# Patient Record
Sex: Male | Born: 1975 | Race: Black or African American | Hispanic: No | Marital: Single
Health system: Midwestern US, Community
[De-identification: ages and names within clinical notes are randomized; demographics above are authoritative.]

## PROBLEM LIST (undated history)

## (undated) DIAGNOSIS — T7840XA Allergy, unspecified, initial encounter: Secondary | ICD-10-CM

## (undated) DIAGNOSIS — J4540 Moderate persistent asthma, uncomplicated: Secondary | ICD-10-CM

## (undated) DIAGNOSIS — F17200 Nicotine dependence, unspecified, uncomplicated: Secondary | ICD-10-CM

## (undated) DIAGNOSIS — D563 Thalassemia minor: Secondary | ICD-10-CM

## (undated) DIAGNOSIS — J302 Other seasonal allergic rhinitis: Secondary | ICD-10-CM

## (undated) DIAGNOSIS — Z1211 Encounter for screening for malignant neoplasm of colon: Secondary | ICD-10-CM

## (undated) HISTORY — DX: Thalassemia minor: D56.3

## (undated) HISTORY — DX: Nicotine dependence, unspecified, uncomplicated: F17.200

## (undated) HISTORY — DX: Moderate persistent asthma, uncomplicated: J45.40

## (undated) HISTORY — DX: Encounter for screening for malignant neoplasm of colon: Z12.11

## (undated) HISTORY — DX: Other seasonal allergic rhinitis: J30.2

## (undated) HISTORY — DX: Allergy, unspecified, initial encounter: T78.40XA

---

## 2000-10-26 ENCOUNTER — Encounter: Payer: Self-pay | Admitting: Emergency Medicine

## 2000-10-26 ENCOUNTER — Emergency Department (HOSPITAL_COMMUNITY): Admission: EM | Admit: 2000-10-26 | Discharge: 2000-10-26 | Payer: Self-pay | Admitting: Emergency Medicine

## 2002-06-15 ENCOUNTER — Encounter: Payer: Self-pay | Admitting: Emergency Medicine

## 2002-06-15 ENCOUNTER — Emergency Department (HOSPITAL_COMMUNITY): Admission: EM | Admit: 2002-06-15 | Discharge: 2002-06-15 | Payer: Self-pay | Admitting: Emergency Medicine

## 2002-09-15 ENCOUNTER — Encounter: Payer: Self-pay | Admitting: Emergency Medicine

## 2002-09-15 ENCOUNTER — Emergency Department (HOSPITAL_COMMUNITY): Admission: EM | Admit: 2002-09-15 | Discharge: 2002-09-15 | Payer: Self-pay | Admitting: Emergency Medicine

## 2003-03-22 ENCOUNTER — Emergency Department (HOSPITAL_COMMUNITY): Admission: EM | Admit: 2003-03-22 | Discharge: 2003-03-22 | Payer: Self-pay | Admitting: Emergency Medicine

## 2005-01-08 ENCOUNTER — Emergency Department (HOSPITAL_COMMUNITY): Admission: EM | Admit: 2005-01-08 | Discharge: 2005-01-08 | Payer: Self-pay | Admitting: Emergency Medicine

## 2005-12-01 ENCOUNTER — Emergency Department (HOSPITAL_COMMUNITY): Admission: EM | Admit: 2005-12-01 | Discharge: 2005-12-01 | Payer: Self-pay | Admitting: Emergency Medicine

## 2007-07-17 IMAGING — CR DG CHEST 2V
2 series · 2 of 2 positions shown · non-contrast
Comparison: none

HISTORY: Asthma, tachycardia, chest pain, headache

CHEST 2 VIEWS:
Prior exams not currently available for comparison.
Normal heart size, mediastinal contours, and vascularity.
Lungs clear.
No effusion or pneumothorax.
Bones unremarkable.

[w chest pa]
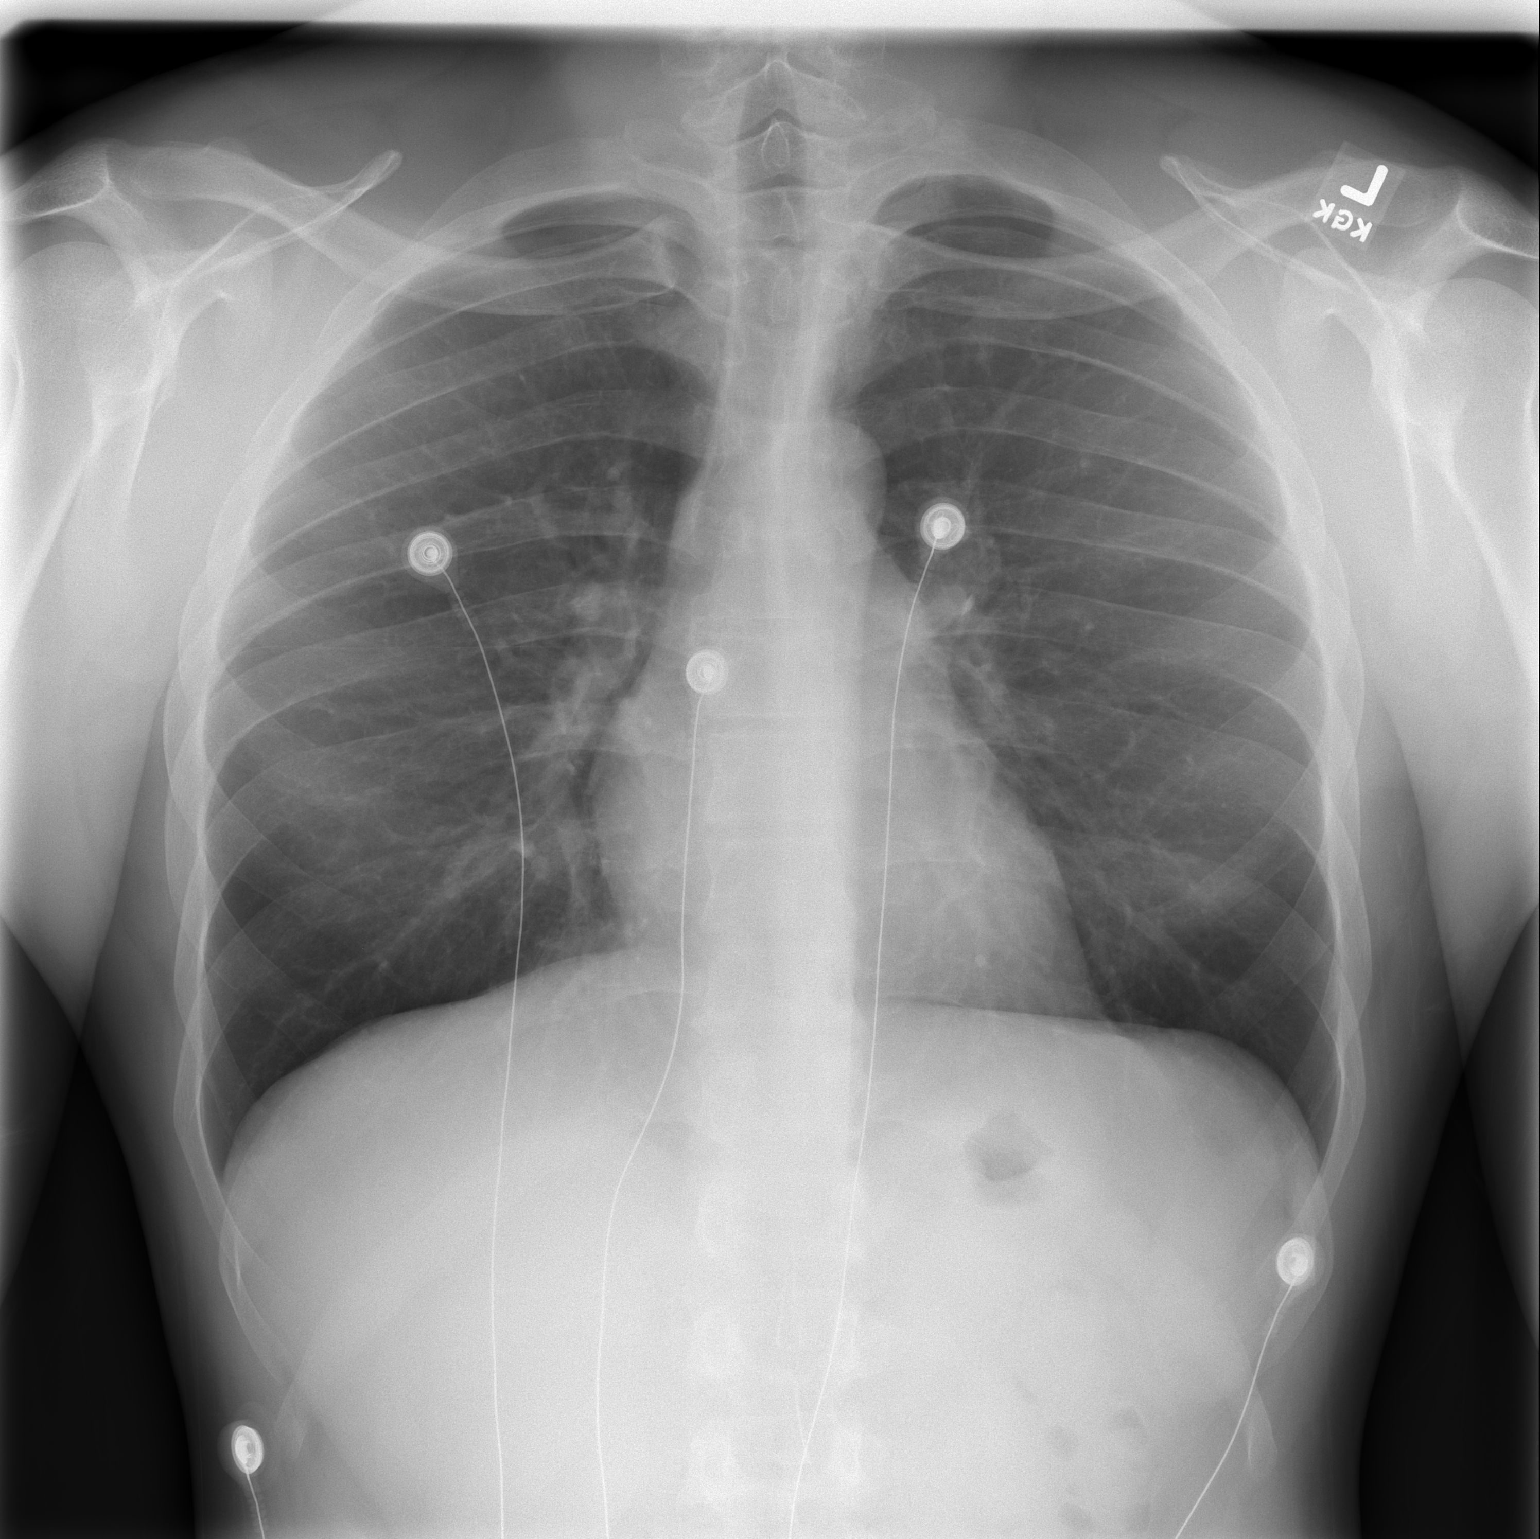

[w chest lat]
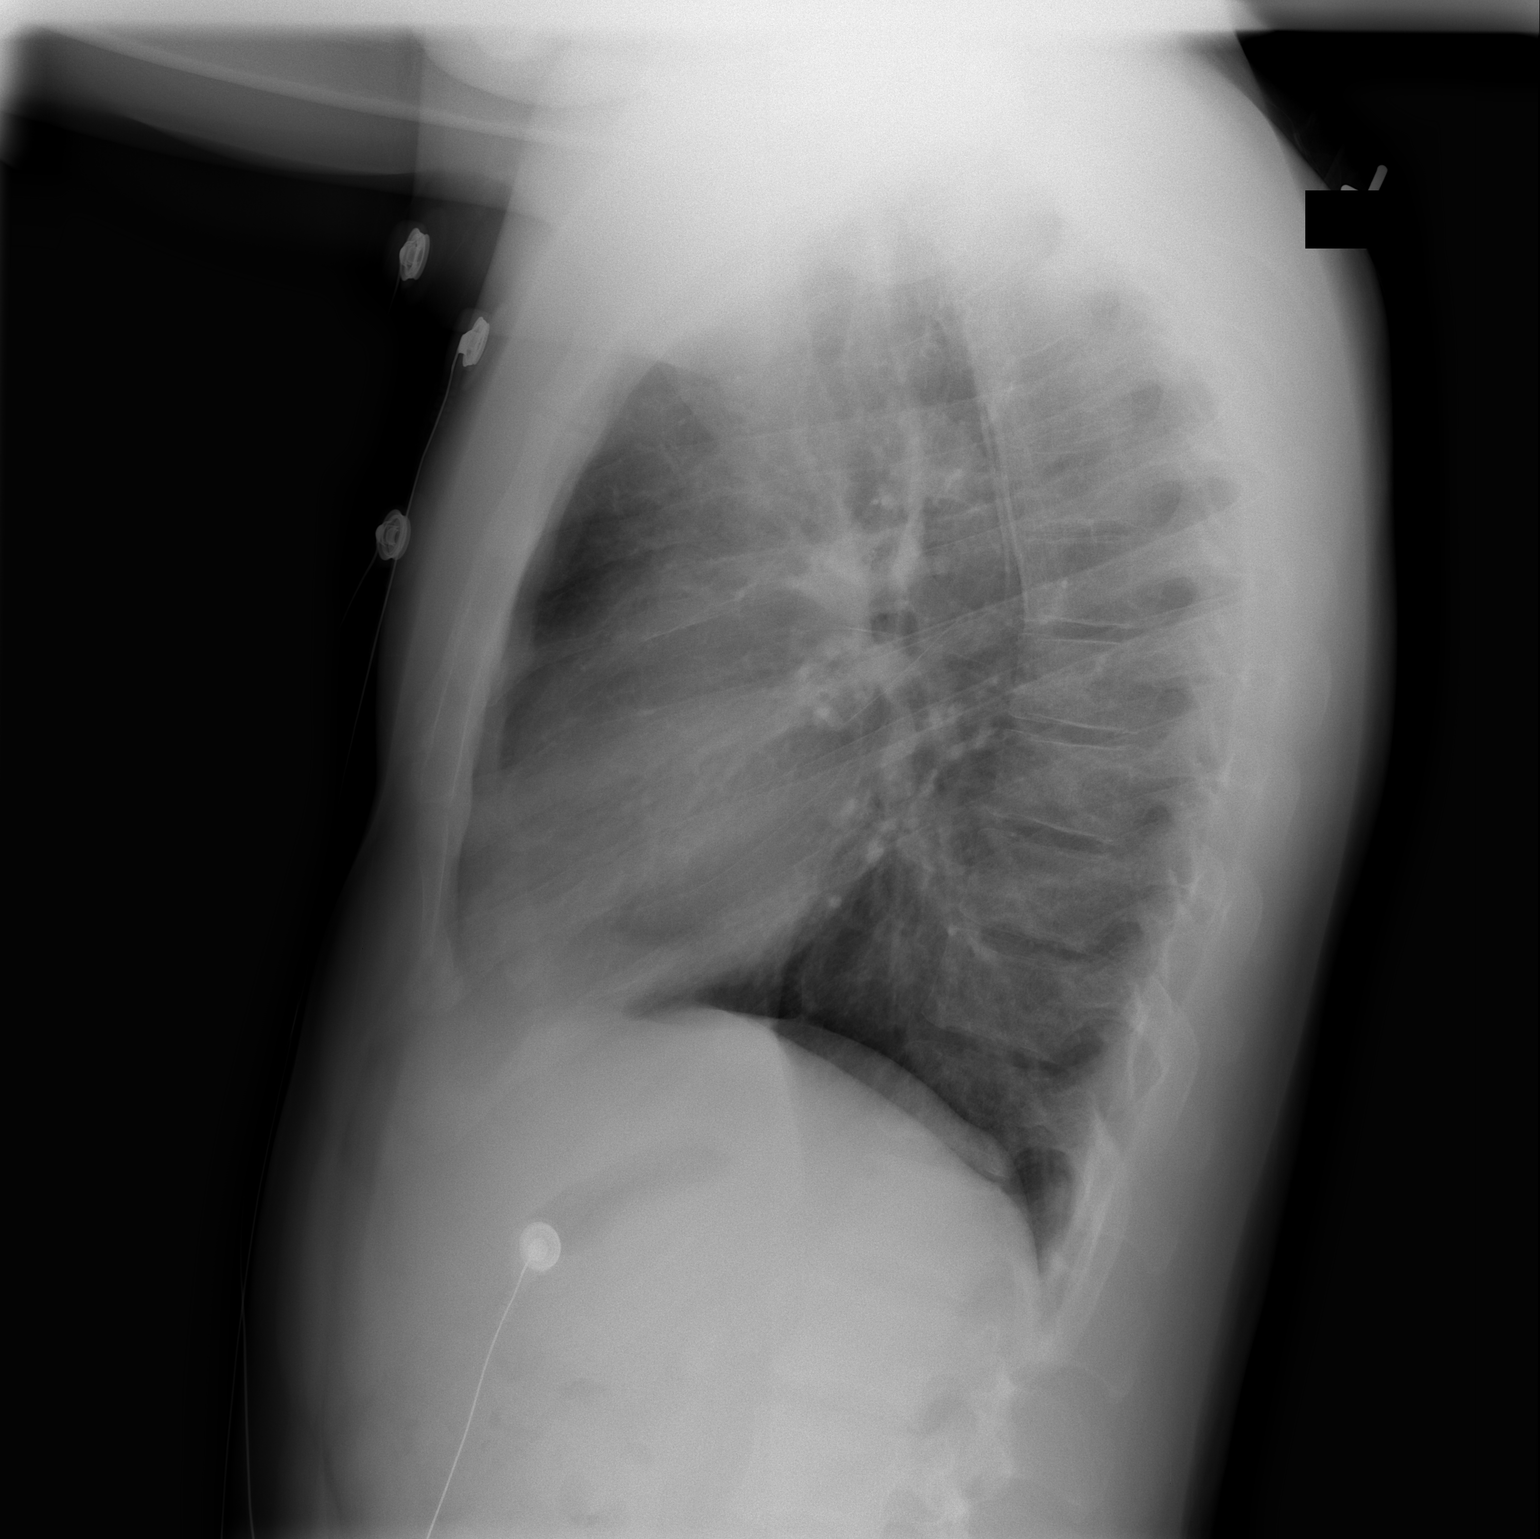

[2 of 2 positions shown; findings below may reference images not displayed]

IMPRESSION: No acute abnormalities.

## 2011-01-20 ENCOUNTER — Other Ambulatory Visit: Payer: Self-pay

## 2011-01-20 ENCOUNTER — Emergency Department (HOSPITAL_COMMUNITY)
Admission: EM | Admit: 2011-01-20 | Discharge: 2011-01-20 | Disposition: A | Discharge status: Home / Self Care | Payer: Self-pay | Attending: Emergency Medicine | Admitting: Emergency Medicine

## 2011-01-20 DIAGNOSIS — R0602 Shortness of breath: Secondary | ICD-10-CM | POA: Insufficient documentation

## 2011-01-20 DIAGNOSIS — Z79899 Other long term (current) drug therapy: Secondary | ICD-10-CM | POA: Insufficient documentation

## 2011-01-20 DIAGNOSIS — J45901 Unspecified asthma with (acute) exacerbation: Secondary | ICD-10-CM | POA: Insufficient documentation

## 2011-01-20 DIAGNOSIS — R079 Chest pain, unspecified: Secondary | ICD-10-CM | POA: Insufficient documentation

## 2011-01-20 MED ORDER — ALBUTEROL SULFATE HFA 108 (90 BASE) MCG/ACT IN AERS
2.0000 | INHALATION_SPRAY | Freq: Once | RESPIRATORY_TRACT | Status: DC
  Filled 2011-01-20: qty 6.7

## 2011-01-20 MED ORDER — IPRATROPIUM BROMIDE 0.02 % IN SOLN: 0.5000 mg | Freq: Once | RESPIRATORY_TRACT | Status: DC

## 2011-01-20 MED ORDER — ALBUTEROL SULFATE (5 MG/ML) 0.5% IN NEBU
2.5000 mg | INHALATION_SOLUTION | Freq: Once | RESPIRATORY_TRACT | Status: AC
  Administered 2011-01-20: 2.5 mg via RESPIRATORY_TRACT
  Filled 2011-01-20: qty 0.5

## 2011-01-20 MED ORDER — ALBUTEROL SULFATE HFA 108 (90 BASE) MCG/ACT IN AERS: 2.0000 | INHALATION_SPRAY | RESPIRATORY_TRACT | Status: DC | PRN

## 2011-01-20 MED ORDER — IPRATROPIUM BROMIDE 0.02 % IN SOLN
0.5000 mg | Freq: Once | RESPIRATORY_TRACT | Status: DC
  Filled 2011-01-20: qty 2.5

## 2011-01-20 NOTE — ED Provider Notes (Signed)
History     CSN: [id number] Arrival date & time: 01/20/2011 12:03 PM   First MD Initiated Contact with Patient 01/20/11 1306      Chief Complaint  Patient presents with  . Chest Pain  . Shortness of Breath  . Asthma    (Consider location/radiation/quality/duration/timing/severity/associated sxs/prior treatment) HPI Comments: Patient comes in complaining of asthma exacerbation.  He notes that he cut grass on Friday and that is a normal trigger for his asthma.  He did not have an albuterol inhaler at home to use for his symptoms.  He finally decided to come in today so he can get some albuterol and breathing treatments.  He has no fevers.  He describes mild chest pain earlier that was nonexertional and he does not have this pain at this time.  Patient has no significant coughing.  No primary care physician.  Patient is a 35 y.o. male presenting with chest pain, shortness of breath, and asthma. The history is provided by the patient. No language interpreter was used.  Chest Pain The chest pain began yesterday. Chest pain occurs constantly. The chest pain is unchanged. Primary symptoms include shortness of breath. Pertinent negatives for primary symptoms include no fever, no cough, no abdominal pain, no nausea and no vomiting.  The patient's medical history is significant for asthma.    Shortness of Breath  Associated symptoms include chest pain and shortness of breath. Pertinent negatives include no fever and no cough. His past medical history is significant for asthma.  Asthma Associated symptoms include chest pain and shortness of breath. Pertinent negatives include no abdominal pain and no headaches.    Past Medical History  Diagnosis Date  . Asthma     History reviewed. No pertinent past surgical history.  History reviewed. No pertinent family history.  History  Substance Use Topics  . Smoking status: Current Some Day Smoker  . Smokeless tobacco: Not on file  . Alcohol  Use: Yes      Review of Systems  Constitutional: Negative.  Negative for fever and chills.  HENT: Negative.   Eyes: Negative.  Negative for discharge and redness.  Respiratory: Positive for shortness of breath. Negative for cough.   Cardiovascular: Positive for chest pain.  Gastrointestinal: Negative.  Negative for nausea, vomiting and abdominal pain.  Genitourinary: Negative.  Negative for hematuria.  Musculoskeletal: Negative.  Negative for back pain.  Skin: Negative.  Negative for color change and rash.  Neurological: Negative for syncope and headaches.  Hematological: Negative.  Negative for adenopathy.  Psychiatric/Behavioral: Negative.  Negative for confusion.  All other systems reviewed and are negative.    Allergies  Peanut-containing drug products  Home Medications   Current Outpatient Rx  Name Route Sig Dispense Refill  . ALBUTEROL SULFATE HFA 108 (90 BASE) MCG/ACT IN AERS Inhalation Inhale 2 puffs into the lungs every 4 (four) hours as needed. For shortness of breath     . RANITIDINE HCL 150 MG PO TABS Oral Take 150 mg by mouth daily as needed. For acid reflux       BP 135/87  Pulse 87  Temp(Src) 98.3 F (36.8 C) (Oral)  Resp 20  SpO2 96%  Physical Exam  Constitutional: He is oriented to person, place, and time. He appears well-developed and well-nourished.  Non-toxic appearance. He does not have a sickly appearance.  HENT:  Head: Normocephalic and atraumatic.  Eyes: Conjunctivae, EOM and lids are normal. Pupils are equal, round, and reactive to light.  Neck: Trachea normal,  normal range of motion and full passive range of motion without pain. Neck supple.  Cardiovascular: Normal rate, regular rhythm and normal heart sounds.   Pulmonary/Chest: Effort normal. No respiratory distress. He has wheezes. He has no rales. He exhibits no tenderness.       Post nebulizer treatment patient has mild wheezing in the bilateral upper fields only.    Abdominal: Soft.  Normal appearance. He exhibits no distension. There is no tenderness. There is no rebound and no CVA tenderness.  Musculoskeletal: Normal range of motion.  Neurological: He is alert and oriented to person, place, and time. He has normal strength.  Skin: Skin is warm, dry and intact. No rash noted.  Psychiatric: He has a normal mood and affect. His behavior is normal. Judgment and thought content normal.    ED Course  Procedures (including critical care time)  Labs Reviewed - No data to display No results found.   No diagnosis found.    MDM  Patient presents with very mild asthma exacerbation.  After one neb the patient has minimal wheezing in his upper fields.  Given the mild nature of this I do not feel the patient warrants steroids at this time.  He will be given a prescription for an albuterol MDI to be used at home.  He's been advised about the importance of a primary care physician for continued followup for his asthma.  He's also been counseled that he should completely quit smoking.        Nat Christen, MD 01/20/11 1318

## 2011-01-20 NOTE — ED Notes (Signed)
Patient present with bilateral chest pressure/tightness and shortness of breath.  Patient has a history of asthma and audible wheezing noted.  Patient reports pressure to be 4/10.

## 2011-01-20 NOTE — ED Notes (Signed)
Discharge instructions reviewed with pt; verbalizes understanding.  Inhaler given at discharge; minimal instructions needed.  VSS.  Pt ambulatory to lobby .  NAD noted.

## 2011-01-20 NOTE — ED Notes (Signed)
Patient has an allergy to peanuts, Atrovent was not given

## 2011-01-20 NOTE — ED Notes (Signed)
EKG shown to Dr. Doylene Canard

## 2011-01-28 ENCOUNTER — Inpatient Hospital Stay (HOSPITAL_COMMUNITY)
Admission: EM | Admit: 2011-01-28 | Discharge: 2011-01-29 | DRG: 203 | Disposition: A | Discharge status: Home / Self Care | Payer: Self-pay | Attending: Internal Medicine | Admitting: Internal Medicine

## 2011-01-28 ENCOUNTER — Encounter (HOSPITAL_COMMUNITY): Payer: Self-pay | Admitting: *Deleted

## 2011-01-28 ENCOUNTER — Other Ambulatory Visit: Payer: Self-pay

## 2011-01-28 ENCOUNTER — Emergency Department (HOSPITAL_COMMUNITY): Payer: Self-pay

## 2011-01-28 DIAGNOSIS — F17200 Nicotine dependence, unspecified, uncomplicated: Secondary | ICD-10-CM | POA: Diagnosis present

## 2011-01-28 DIAGNOSIS — J209 Acute bronchitis, unspecified: Secondary | ICD-10-CM | POA: Diagnosis present

## 2011-01-28 DIAGNOSIS — J45901 Unspecified asthma with (acute) exacerbation: Principal | ICD-10-CM | POA: Diagnosis present

## 2011-01-28 DIAGNOSIS — Z720 Tobacco use: Secondary | ICD-10-CM | POA: Diagnosis present

## 2011-01-28 DIAGNOSIS — K219 Gastro-esophageal reflux disease without esophagitis: Secondary | ICD-10-CM | POA: Diagnosis present

## 2011-01-28 LAB — DIFFERENTIAL
Basophils Absolute: 0.1 10*3/uL (ref 0.0–0.1)
Eosinophils Absolute: 0.3 10*3/uL (ref 0.0–0.7)
Lymphocytes Relative: 47 % — ABNORMAL HIGH (ref 12–46)
Monocytes Absolute: 0.6 10*3/uL (ref 0.1–1.0)
Neutrophils Relative %: 37 % — ABNORMAL LOW (ref 43–77)

## 2011-01-28 LAB — COMPREHENSIVE METABOLIC PANEL
ALT: 63 U/L — ABNORMAL HIGH (ref 0–53)
Albumin: 3.9 g/dL (ref 3.5–5.2)
Alkaline Phosphatase: 85 U/L (ref 39–117)
Calcium: 9.3 mg/dL (ref 8.4–10.5)
GFR calc Af Amer: >90 mL/min (ref >90)
Glucose, Bld: 105 mg/dL — ABNORMAL HIGH (ref 70–99)
Potassium: 4.1 mEq/L (ref 3.5–5.1)
Sodium: 139 mEq/L (ref 135–145)
Total Protein: 7.7 g/dL (ref 6.0–8.3)

## 2011-01-28 LAB — CBC
Hemoglobin: 14 g/dL (ref 13.0–17.0)
MCH: 25.7 pg — ABNORMAL LOW (ref 26.0–34.0)
MCHC: 35.3 g/dL (ref 30.0–36.0)
Platelets: 220 10*3/uL (ref 150–400)
RDW: 13.6 % (ref 11.5–15.5)

## 2011-01-28 MED ORDER — PREDNISONE 20 MG PO TABS
60.0000 mg | ORAL_TABLET | Freq: Once | ORAL | Status: AC
  Administered 2011-01-28: 60 mg via ORAL
  Filled 2011-01-28: qty 3

## 2011-01-28 MED ORDER — ALBUTEROL SULFATE (5 MG/ML) 0.5% IN NEBU
2.5000 mg | INHALATION_SOLUTION | Freq: Once | RESPIRATORY_TRACT | Status: AC
  Administered 2011-01-28: 2.5 mg via RESPIRATORY_TRACT
  Filled 2011-01-28: qty 0.5

## 2011-01-28 MED ORDER — ALBUTEROL SULFATE (5 MG/ML) 0.5% IN NEBU
INHALATION_SOLUTION | RESPIRATORY_TRACT | Status: AC
  Administered 2011-01-28: 5 mg via RESPIRATORY_TRACT
  Filled 2011-01-28: qty 1

## 2011-01-28 MED ORDER — MAGNESIUM SULFATE 40 MG/ML IJ SOLN
2.0000 g | Freq: Once | INTRAMUSCULAR | Status: AC
  Administered 2011-01-28: 2 g via INTRAVENOUS
  Filled 2011-01-28: qty 50

## 2011-01-28 MED ORDER — ALBUTEROL SULFATE (5 MG/ML) 0.5% IN NEBU
5.0000 mg | INHALATION_SOLUTION | Freq: Once | RESPIRATORY_TRACT | Status: AC
  Administered 2011-01-28: 5 mg via RESPIRATORY_TRACT
  Filled 2011-01-28: qty 1

## 2011-01-28 MED ORDER — METHYLPREDNISOLONE SODIUM SUCC 125 MG IJ SOLR
125.0000 mg | Freq: Once | INTRAMUSCULAR | Status: AC
  Administered 2011-01-28: 125 mg via INTRAVENOUS
  Filled 2011-01-28: qty 2

## 2011-01-28 MED ORDER — ALBUTEROL SULFATE (5 MG/ML) 0.5% IN NEBU
5.0000 mg | INHALATION_SOLUTION | Freq: Once | RESPIRATORY_TRACT | Status: AC
  Administered 2011-01-28 (×2): 5 mg via RESPIRATORY_TRACT

## 2011-01-28 NOTE — H&P (Signed)
PCP:   No primary provider on file.   Chief Complaint:  Shortness of breath  HPI: 35 year old man with a history of asthma presenting to the ED for second time due to worsening shortness of breath, chest tightness, productive cough of white phlegm, and wheezing. Last week he went to [facility] Emergency department for similar symptoms and was treated with nebulizers and sent home. Today he presents feeling no better. He was evaluated by Dr. Dierdre Searles and received nebulizations with partial improvement in symptoms. Patient has no history of intubations.  Allergies:   Allergies  Allergen Reactions  . Peanut-Containing Drug Products Swelling    Swelling of the tongue      Past Medical History  Diagnosis Date  . Asthma. Since he was a child.  Allergic rhinitis to ragweed, dust, mold, polen     History reviewed. No pertinent past surgical history.  Prior to Admission medications   Medication Sig Start Date End Date Taking? Authorizing Provider  albuterol (PROVENTIL HFA;VENTOLIN HFA) 108 (90 BASE) MCG/ACT inhaler Inhale 2 puffs into the lungs every 4 (four) hours as needed for wheezing. 01/20/11 01/20/12 Yes Nat Christen, MD  ranitidine (ZANTAC) 150 MG tablet Take 150 mg by mouth daily as needed. For acid reflux    Yes Historical Provider, MD    Social History:  reports that he has been smoking. He reports that he quit smoking 2 weeks ago.  He does not have any smokeless tobacco history on file. He reports that he drinks alcohol. He reports that he uses marijuana occasionally  No family history of asthma or COPD.  Review of Systems:  Constitutional: Denies fever, chills, diaphoresis, appetite change and fatigue.  HEENT: Reports nasal congestion and runny nose that began last week. Denies epistaxis, ear ache, sore throat.  Respiratory: See history of present illness  Cardiovascular: Denies chest pain, palpitations and leg swelling.  Gastrointestinal: Denies nausea, vomiting, abdominal  pain, diarrhea, constipation, blood in stool and abdominal distention.  Genitourinary: Denies dysuria, urgency, frequency, hematuria, flank pain and difficulty urinating.  Musculoskeletal: Denies myalgias, back pain, joint swelling, arthralgias and gait problem.  Skin: Denies pallor, rash and wound.  Neurological: Denies dizziness, seizures, syncope, weakness, light-headedness, numbness and headaches.   Physical Exam: Blood pressure 129/83, pulse 74, temperature 97.5 F (36.4 C), temperature source Oral, resp. rate 16, height 5\' 9"  (1.753 m), weight 83.915 kg (185 lb), SpO2 97.00%. Physical Exam  Constitutional: He is oriented to person, place, and time. He appears well-developed and well-nourished. No distress.  HENT:  Right Ear: External ear normal.  Left Ear: External ear normal.  Nose: Nose normal.  Mouth/Throat: Oropharynx is clear and moist.  Eyes: Conjunctivae and EOM are normal. Pupils are equal, round, and reactive to light.  Neck: Neck supple.  Cardiovascular: Normal rate, regular rhythm and normal heart sounds.   Pulmonary/Chest: He has wheezes.  Abdominal: Soft. Bowel sounds are normal. He exhibits no distension and no mass. There is no tenderness. There is no rebound and no guarding.  Lymphadenopathy:    He has no cervical adenopathy.  Neurological: He is alert and oriented to person, place, and time. He has normal reflexes. He exhibits normal muscle tone.  Skin: Skin is warm and dry. He is not diaphoretic.     Labs on Admission:  Results for orders placed during the hospital encounter of 01/28/11 (from the past 48 hour(s))  CBC     Status: Abnormal   Collection Time   01/28/11  9:04 AM  Component Value Range Comment   WBC 5.7  4.0 - 10.5 (K/uL)    RBC 5.45  4.22 - 5.81 (MIL/uL)    Hemoglobin 14.0  13.0 - 17.0 (g/dL)    HCT 16.1  09.6 - 04.5 (%)    MCV 72.8 (*) 78.0 - 100.0 (fL)    MCH 25.7 (*) 26.0 - 34.0 (pg)    MCHC 35.3  30.0 - 36.0 (g/dL)    RDW 40.9   81.1 - 91.4 (%)    Platelets 220  150 - 400 (K/uL)   DIFFERENTIAL     Status: Abnormal   Collection Time   01/28/11  9:04 AM      Component Value Range Comment   Neutrophils Relative 37 (*) 43 - 77 (%)    Lymphocytes Relative 47 (*) 12 - 46 (%)    Monocytes Relative 10  3 - 12 (%)    Eosinophils Relative 5  0 - 5 (%)    Basophils Relative 1  0 - 1 (%)    Neutro Abs 2.1  1.7 - 7.7 (K/uL)    Lymphs Abs 2.6  0.7 - 4.0 (K/uL)    Monocytes Absolute 0.6  0.1 - 1.0 (K/uL)    Eosinophils Absolute 0.3  0.0 - 0.7 (K/uL)    Basophils Absolute 0.1  0.0 - 0.1 (K/uL)    RBC Morphology TARGET CELLS     COMPREHENSIVE METABOLIC PANEL     Status: Abnormal   Collection Time   01/28/11  9:04 AM      Component Value Range Comment   Sodium 139  135 - 145 (mEq/L)    Potassium 4.1  3.5 - 5.1 (mEq/L)    Chloride 100  96 - 112 (mEq/L)    CO2 30  19 - 32 (mEq/L)    Glucose, Bld 105 (*) 70 - 99 (mg/dL)    BUN 13  6 - 23 (mg/dL)    Creatinine, Ser 7.82  0.50 - 1.35 (mg/dL)    Calcium 9.3  8.4 - 10.5 (mg/dL)    Total Protein 7.7  6.0 - 8.3 (g/dL)    Albumin 3.9  3.5 - 5.2 (g/dL)    AST 48 (*) 0 - 37 (U/L)    ALT 63 (*) 0 - 53 (U/L)    Alkaline Phosphatase 85  39 - 117 (U/L)    Total Bilirubin 0.2 (*) 0.3 - 1.2 (mg/dL)    GFR calc non Af Amer 83 (*) >90 (mL/min)    GFR calc Af Amer >90  >90 (mL/min)   MAGNESIUM     Status: Normal   Collection Time   01/28/11  9:04 AM      Component Value Range Comment   Magnesium 2.3  1.5 - 2.5 (mg/dL)     Radiological Exams on Admission: Chest x-ray NAD  Assessment/Plan Active Problems: Asthma exacerbation admit patient to the hospital. Continue Atrovent and Proventil nebulizers every 6 hours. Start Solu-Medrol 60 mg IV every 6. Continue pulse oximetry monitoring. Repeat electrolytes in a.m.  Time Spent on Admission: 30 minutes  Jonny Ruiz 01/28/2011, 3:18 PM

## 2011-01-28 NOTE — ED Provider Notes (Signed)
History     CSN: [id number] Arrival date & time: 01/28/2011  7:15 AM   First MD Initiated Contact with Patient 11/20/Carl 0730      Chief Complaint  Patient presents with  . Asthma    (Consider location/radiation/quality/duration/timing/severity/associated sxs/prior treatment) HPI  This is a 35 year old Tucker with PMH of Asthma who presents to the ED for shortness of breath and cough. The history is provided by patient. Pt states that he started to feel sick 4 days ago. He felt that he had "cold". He reports that he had runny nose and cough with small to moderate sputum, initially green color and now clear color. He felt that his asthma is worsening with shortness of breath aggravated by activity and no alleviating factors. He used his albuterol inhaler 10-Carl times/day without the relief.   Of note, he was hospitalized due to asthma when he was a child, however, he has never had intubation. He had [facility] ED visit one week ago for similar problem and relieved after the Neb treatment.  No headache, fever, or sore throat.  No chest pain, chest pressure or palpitation. No nausea, vomiting, or abdominal pain.  No melena, diarrhea or incontinence. No muscle weakness.                   Past Medical History  Diagnosis Date  . Asthma     History reviewed. No pertinent past surgical history.  No family history on file.  History  Substance Use Topics  . Smoking status: Current Some Day Smoker  . Smokeless tobacco: Not on file  . Alcohol Use: Yes      Review of Systems See HPI  Allergies  Peanut-containing drug products  Home Medications   Current Outpatient Rx  Name Route Sig Dispense Refill  . ALBUTEROL SULFATE HFA 108 (90 BASE) MCG/ACT IN AERS Inhalation Inhale 2 puffs into the lungs every 4 (four) hours as needed. For shortness of breath     . ALBUTEROL SULFATE HFA 108 (90 BASE) MCG/ACT IN AERS Inhalation Inhale 2 puffs into the lungs every 4 (four) hours as needed for  wheezing. 1 Inhaler 0  . RANITIDINE HCL 150 MG PO TABS Oral Take 150 mg by mouth daily as needed. For acid reflux       BP 138/89  Pulse 83  Temp(Src) 97.5 F (36.4 C) (Oral)  Resp 18  Ht 5\' 9"  (1.753 m)  Wt 185 lb (83.915 kg)  BMI 27.32 kg/m2  SpO2 98%  Physical Exam General: NAD, alert, well-developed, and cooperative to examination.  Head: normocephalic and atraumatic.  Eyes: vision grossly intact, pupils equal, pupils round, pupils reactive to light, no injection and anicteric.  Mouth: pharynx pink and moist, no erythema, and no exudates.  Neck: supple, full ROM, no thyromegaly, no JVD, and no carotid bruits.  Lungs: normal respiratory effort, no accessory muscle use, Bilateral lung sound diminished. Ant. And Post. Lungs diffused expiratory wheezing noted throughout. No rales or rhonchi. Heart: normal rate, regular rhythm, no murmur, no gallop, and no rub.  Abdomen: soft, non-tender, normal bowel sounds, no distention, no guarding, no rebound tenderness, no hepatomegaly, and no splenomegaly.  Msk: no joint swelling, no joint warmth, and no redness over joints.  Pulses: 2+ DP/PT pulses bilaterally Extremities: No cyanosis, clubbing, edema Neurologic: alert & oriented X3, cranial nerves II-XII intact, strength normal in all extremities, sensation intact to light touch, and gait normal.  Skin: turgor normal and no rashes.  Psych:  Oriented X3, memory intact for recent and remote, normally interactive, good eye contact, not anxious appearing, and not depressed appearing.   ED Course  Procedures  Date: 01/28/2011  Rate: 34  Rhythm: normal sinus rhythm  QRS Axis: right  Intervals: normal  ST/T Wave abnormalities: nonspecific ST/T changes  Conduction Disutrbances:none  Narrative Interpretation: sinus rhythm.  Old EKG Reviewed: changes noted    1. Asthma exacerbation The clinical manifestation is consistent with Asthma exacerbation. Give his recurrent worsening of  symptoms, - will order chest Xray -will give albuterol Neb treatment and check his peak flow pre and post. - will give him prednisone tapering dose.  No results found for this or any previous visit. Dg Chest 2 View  01/28/2011  *RADIOLOGY REPORT*  Clinical Data: Wheezing.  History of asthma.  CHEST - 2 VIEW  Comparison: 01/08/2005  Findings: Midline trachea.  Normal heart size and mediastinal contours. No pleural effusion or pneumothorax.  Clear lungs.  IMPRESSION: No acute cardiopulmonary disease.  Original Report Authenticated By: Consuello Bossier, M.D.   8:38 AM Pt still feels slightly better after the Neb treatment. Peak flow is 130 pre and 150 post after neb treatment.   B/L Ant. And Post. Lungs diffused expiratory wheezing not improved.  - will give Mg sulfate IV, Solumedrol IV, and check lab work. - will likely admit pt.  11:32 AM Pt has had solu metrol and magnesium and states that he feels better. No c/o SOB while resting. B/L lung exam indicated better air movement, however, B/L lungs sounds Ant. And Post Wheezing unchanged. Will check ambulatory Pulse Ox.  Results for orders placed during the hospital encounter of 11/20/Carl  CBC      Component Value Range   WBC 5.7  4.0 - 10.5 (K/uL)   RBC 5.45  4.22 - 5.81 (MIL/uL)   Hemoglobin 14.0  13.0 - 17.0 (g/dL)   HCT 91.4  78.2 - 95.6 (%)   MCV 72.8 (*) 78.0 - 100.0 (fL)   MCH 25.7 (*) 26.0 - 34.0 (pg)   MCHC 35.3  30.0 - 36.0 (g/dL)   RDW 21.3  08.6 - 57.8 (%)   Platelets 220  150 - 400 (K/uL)  DIFFERENTIAL      Component Value Range   Neutrophils Relative 37 (*) 43 - 77 (%)   Lymphocytes Relative 47 (*) Carl - 46 (%)   Monocytes Relative 10  3 - Carl (%)   Eosinophils Relative 5  0 - 5 (%)   Basophils Relative 1  0 - 1 (%)   Neutro Abs 2.1  1.7 - 7.7 (K/uL)   Lymphs Abs 2.6  0.7 - 4.0 (K/uL)   Monocytes Absolute 0.6  0.1 - 1.0 (K/uL)   Eosinophils Absolute 0.3  0.0 - 0.7 (K/uL)   Basophils Absolute 0.1  0.0 - 0.1 (K/uL)    RBC Morphology TARGET CELLS    COMPREHENSIVE METABOLIC PANEL      Component Value Range   Sodium 139  135 - 145 (mEq/L)   Potassium 4.1  3.5 - 5.1 (mEq/L)   Chloride 100  96 - 112 (mEq/L)   CO2 30  19 - 32 (mEq/L)   Glucose, Bld 105 (*) 70 - 99 (mg/dL)   BUN 13  6 - 23 (mg/dL)   Creatinine, Ser 4.69  0.50 - 1.35 (mg/dL)   Calcium 9.3  8.4 - 62.9 (mg/dL)   Total Protein 7.7  6.0 - 8.3 (g/dL)   Albumin 3.9  3.5 -  5.2 (g/dL)   AST 48 (*) 0 - 37 (U/L)   ALT 63 (*) 0 - 53 (U/L)   Alkaline Phosphatase 85  39 - 117 (U/L)   Total Bilirubin 0.2 (*) 0.3 - 1.2 (mg/dL)   GFR calc non Af Amer 83 (*) >90 (mL/min)   GFR calc Af Amer >90  >90 (mL/min)  MAGNESIUM      Component Value Range   Magnesium 2.3  1.5 - 2.5 (mg/dL)   Dg Chest 2 View  36/64/4034  *RADIOLOGY REPORT*  Clinical Data: Wheezing.  History of asthma.  CHEST - 2 VIEW  Comparison: 01/08/2005  Findings: Midline trachea.  Normal heart size and mediastinal contours. No pleural effusion or pneumothorax.  Clear lungs.  IMPRESSION: No acute cardiopulmonary disease.  Original Report Authenticated By: Consuello Bossier, M.D.   1:52 PM Pt peak flow now 175 and feels better. No shortness of breath. B/L Ant. And Post. diffused expiatory wheezing noted on right side and slightly better on left side.     Dede Query, MD 11/20/Carl 7425  Dede Query, MD 11/20/Carl 9563  Dede Query, MD 11/20/Carl 8756  Dede Query, MD 11/20/Carl 0930  Dede Query, MD 11/20/Carl 1134  Dede Query, MD 11/20/Carl 1349

## 2011-01-28 NOTE — ED Notes (Signed)
Pt reports being seen at [facility] last week for asthma, reports using inhaler 10-12x day with little relief. Reports cold/congestion in last few days with worsening wheezing/shob. O2 96%.

## 2011-01-28 NOTE — ED Notes (Signed)
Walked around ER with no distress. O2 Sat after walking 98% HR 88

## 2011-01-28 NOTE — ED Notes (Signed)
Patient is resting comfortably. 

## 2011-01-28 NOTE — ED Notes (Signed)
I saw and evaluated the patient, reviewed the resident's note and I agree with the findings and plan.  Hx asthma, no admissions since childhood, no PCP or meds, seen at [facility] last week, given nebs.  Improved until yesterday.  Used albuterol 10x.  Productive cough, no chest pain or fever.    Glynn Octave, MD 01/28/11 [phone]

## 2011-01-28 NOTE — ED Notes (Signed)
Dinner tray given

## 2011-01-28 NOTE — ED Notes (Signed)
Report given to Marcelle Smiling rn pt to tcu room 33

## 2011-01-29 DIAGNOSIS — J209 Acute bronchitis, unspecified: Secondary | ICD-10-CM | POA: Diagnosis present

## 2011-01-29 DIAGNOSIS — K219 Gastro-esophageal reflux disease without esophagitis: Secondary | ICD-10-CM | POA: Diagnosis present

## 2011-01-29 DIAGNOSIS — Z720 Tobacco use: Secondary | ICD-10-CM | POA: Diagnosis present

## 2011-01-29 MED ORDER — PREDNISONE 1 MG PO TABS: 10.0000 mg | ORAL_TABLET | Freq: Every day | ORAL | Status: DC

## 2011-01-29 MED ORDER — ONDANSETRON HCL 4 MG/2ML IJ SOLN: 4.0000 mg | Freq: Four times a day (QID) | INTRAMUSCULAR | Status: DC | PRN

## 2011-01-29 MED ORDER — ALBUTEROL SULFATE HFA 108 (90 BASE) MCG/ACT IN AERS: 2.0000 | INHALATION_SPRAY | RESPIRATORY_TRACT | Status: DC | PRN

## 2011-01-29 MED ORDER — ONDANSETRON HCL 4 MG PO TABS: 4.0000 mg | ORAL_TABLET | Freq: Four times a day (QID) | ORAL | Status: DC | PRN

## 2011-01-29 MED ORDER — AZITHROMYCIN 250 MG PO TABS: ORAL_TABLET | ORAL | Status: AC

## 2011-01-29 MED ORDER — ALBUTEROL SULFATE (5 MG/ML) 0.5% IN NEBU
2.5000 mg | INHALATION_SOLUTION | Freq: Four times a day (QID) | RESPIRATORY_TRACT | Status: DC
  Administered 2011-01-29 (×2): 2.5 mg via RESPIRATORY_TRACT
  Filled 2011-01-29: qty 0.5

## 2011-01-29 MED ORDER — IPRATROPIUM BROMIDE 0.02 % IN SOLN
0.5000 mg | Freq: Four times a day (QID) | RESPIRATORY_TRACT | Status: DC
  Administered 2011-01-29 (×2): 0.5 mg via RESPIRATORY_TRACT
  Filled 2011-01-29: qty 2.5

## 2011-01-29 MED ORDER — ALUM & MAG HYDROXIDE-SIMETH 200-200-20 MG/5ML PO SUSP: 30.0000 mL | Freq: Four times a day (QID) | ORAL | Status: DC | PRN

## 2011-01-29 MED ORDER — FLUTICASONE-SALMETEROL 250-50 MCG/DOSE IN AEPB: 1.0000 | INHALATION_SPRAY | Freq: Two times a day (BID) | RESPIRATORY_TRACT | Status: DC

## 2011-01-29 MED ORDER — ALBUTEROL SULFATE (5 MG/ML) 0.5% IN NEBU
INHALATION_SOLUTION | RESPIRATORY_TRACT | Status: AC
  Filled 2011-01-29: qty 0.5

## 2011-01-29 MED ORDER — ACETAMINOPHEN 650 MG RE SUPP: 650.0000 mg | Freq: Four times a day (QID) | RECTAL | Status: DC | PRN

## 2011-01-29 MED ORDER — PREDNISONE 1 MG PO TABS: 10.0000 mg | ORAL_TABLET | Freq: Every day | ORAL | Status: AC

## 2011-01-29 MED ORDER — SENNA 8.6 MG PO TABS: 2.0000 | ORAL_TABLET | Freq: Every day | ORAL | Status: DC | PRN

## 2011-01-29 MED ORDER — ACETAMINOPHEN 325 MG PO TABS: 650.0000 mg | ORAL_TABLET | Freq: Four times a day (QID) | ORAL | Status: DC | PRN

## 2011-01-29 MED ORDER — POTASSIUM CHLORIDE IN NACL 20-0.9 MEQ/L-% IV SOLN
INTRAVENOUS | Status: DC
  Administered 2011-01-29: 02:00:00 via INTRAVENOUS
  Filled 2011-01-29 (×3): qty 1000

## 2011-01-29 MED ORDER — IPRATROPIUM BROMIDE 0.02 % IN SOLN
RESPIRATORY_TRACT | Status: AC
  Filled 2011-01-29: qty 2.5

## 2011-01-29 MED ORDER — ALBUTEROL SULFATE (5 MG/ML) 0.5% IN NEBU: 2.5000 mg | INHALATION_SOLUTION | RESPIRATORY_TRACT | Status: DC | PRN

## 2011-01-29 MED ORDER — AZITHROMYCIN 250 MG PO TABS: ORAL_TABLET | ORAL | Status: DC

## 2011-01-29 NOTE — Progress Notes (Signed)
Patient received discharge instructions with friend at bedside. Verbalized understanding without further questions. Patient belongings packed, patient prescriptions given, patient follow up appts discussed. Patient to ambulate to pharmacy prior to discharge home to pick up medications.

## 2011-01-29 NOTE — Progress Notes (Signed)
   CARE MANAGEMENT NOTE 01/29/2011  Patient:  Carl Tucker, Carl Tucker   Account Number:  [id number]  Date Initiated:  01/29/2011  Documentation initiated by:  Lorenda Ishihara  Subjective/Objective Assessment:   35 yo male admitted with Asthma exacerbation. No PCP.     Action/Plan:   lives with family; self pay   Anticipated DC Date:  01/29/2011   Anticipated DC Plan:  HOME/SELF CARE  In-house referral  Financial Counselor      DC Planning Services  CM consult  Follow-up appt scheduled  Medication Assistance      [facility] Choice  NA   Choice offered to / List presented to:  NA   DME arranged  NA      DME agency  NA     HH arranged  NA      HH agency  NA   Status of service:  Completed, signed off Medicare Important Message given?   (If response is "NO", the following Medicare IM given date fields will be blank) Date Medicare IM given:   Date Additional Medicare IM given:    Discharge Disposition:  HOME/SELF CARE  Per UR Regulation:  Reviewed for med. necessity/level of care/duration of stay  Comments:  01/29/11 1301 Spoke with patient earlier regarding [facility] appts and med assist. Made [facility] referral. Qualifies for indigent funds. Meds sent to pharmacy to fill. [facility] appts for eligiblity 03/20/10 at 0930am; Dr. Andrey Campanile with [facility] made for 04/01/10 at 1030am. Pt given information. No further needs assessed. Raiford Noble, RN, BSN. [phone].

## 2011-01-29 NOTE — Plan of Care (Signed)
Problem: Consults Goal: Respiratory Problems Patient Education See Patient Education Module for education specifics. Pt to be free of respiratory distress upon discharge. Able to give himself his breathing treatments and notify MD of any respiratory issures.

## 2011-01-29 NOTE — Progress Notes (Signed)
UR complete 

## 2011-01-29 NOTE — Discharge Summary (Signed)
Patient ID: Carl Tucker MRN: [id number] DOB/AGE: 1975-11-08 35 y.o.  Admit date: 01/28/2011 Discharge date: 01/29/2011  Primary Care Physician:  No primary provider on file.  Discharge Diagnoses:     Asthma exacerbation  Bronchitis, acute  GERD (gastroesophageal reflux disease)  Tobacco abuse   Current Discharge Medication List    START taking these medications   Details  azithromycin (ZITHROMAX Z-PAK) 250 MG tablet Take 2 tablets po on day 1, then 1 tablet po daily until complete Qty: 6 each, Refills: 0    Fluticasone-Salmeterol (ADVAIR DISKUS) 250-50 MCG/DOSE AEPB Inhale 1 puff into the lungs 2 (two) times daily. Qty: 60 each, Refills: 1    predniSONE (DELTASONE) 1 MG tablet Take 10 tablets (10 mg total) by mouth daily. Qty: 20 tablet, Refills: 0      CONTINUE these medications which have CHANGED   Details  albuterol (PROVENTIL HFA;VENTOLIN HFA) 108 (90 BASE) MCG/ACT inhaler Inhale 2 puffs into the lungs every 4 (four) hours as needed for wheezing. Qty: 1 Inhaler, Refills: 0      CONTINUE these medications which have NOT CHANGED   Details  ranitidine (ZANTAC) 150 MG tablet Take 150 mg by mouth daily as needed. For acid reflux         Disposition and Follow-up: Patient will be set up with [facility]  Consults:  none  Significant Diagnostic Studies:  Dg Chest 2 View  01/28/2011  *RADIOLOGY REPORT*  Clinical Data: Wheezing.  History of asthma.  CHEST - 2 VIEW  Comparison: 01/08/2005  Findings: Midline trachea.  Normal heart size and mediastinal contours. No pleural effusion or pneumothorax.  Clear lungs.  IMPRESSION: No acute cardiopulmonary disease.  Original Report Authenticated By: Consuello Bossier, M.D.    Brief H and P: For complete details please refer to admission H and P, but in brief 35 year old man with a history of asthma presenting to the ED for second time due to worsening shortness of breath, chest tightness, productive cough of white phlegm,  and wheezing. Last week he went to [facility] Emergency department for similar symptoms and was treated with nebulizers and sent home. Today he presents feeling no better. He was evaluated by Dr. Dierdre Searles and received nebulizations with partial improvement in symptoms. Patient has no history of intubations.  Physical Exam on Discharge:  Filed Vitals:   01/29/11 0143 01/29/11 0349 01/29/11 0514 01/29/11 0828  BP:   133/70   Pulse:   78   Temp:   98.1 F (36.7 C)   TempSrc:   Oral   Resp:   16   Height:  5\' 9"  (1.753 m)    Weight:  81.109 kg (178 lb 13 oz)    SpO2: 94%  96% 98%     Intake/Output Summary (Last 24 hours) at 01/29/11 1058 Last data filed at 01/29/11 0844  Gross per 24 hour  Intake 896.25 ml  Output      0 ml  Net 896.25 ml    General: Alert, awake, oriented x3, in no acute distress. HEENT: No bruits, no goiter. Heart: Regular rate and rhythm, without murmurs, rubs, gallops. Lungs: Mild wheeze bilaterally, improved Abdomen: Soft, nontender, nondistended, positive bowel sounds. Extremities: No clubbing cyanosis or edema with positive pedal pulses. Neuro: Grossly intact, nonfocal.  CBC:    Component Value Date/Time   WBC 5.7 01/28/2011 0904   HGB 14.0 01/28/2011 0904   HCT 39.7 01/28/2011 0904   PLT 220 01/28/2011 0904   MCV 72.8* 01/28/2011 0904  NEUTROABS 2.1 01/28/2011 0904   LYMPHSABS 2.6 01/28/2011 0904   MONOABS 0.6 01/28/2011 0904   EOSABS 0.3 01/28/2011 0904   BASOSABS 0.1 01/28/2011 0904    Basic Metabolic Panel:    Component Value Date/Time   NA 139 01/28/2011 0904   K 4.1 01/28/2011 0904   CL 100 01/28/2011 0904   CO2 30 01/28/2011 0904   BUN 13 01/28/2011 0904   CREATININE 1.14 01/28/2011 0904   GLUCOSE 105* 01/28/2011 0904   CALCIUM 9.3 01/28/2011 0904    Hospital Course:    *Asthma exacerbation: He was placed on scheduled nebulization treatments. He received a dose of Solu-Medrol in the emergency room. With these measures, he reports  significant improvement in his breathing, and return to baseline. He is currently ambulating in the halls without any supplemental oxygen. Reports that his breathing is back to his baseline. He does have a mild cough. Patient will be placed on oral prednisone. He'll also be placed on a short course of antibiotics to cover any bronchitis component. He reports that his asthma requires albuterol administration approximately 3-4 times a week. He says that in the past he has been on Advair with significant improvement of his symptoms. More recently he has not been on Advair  due to lack of funds. We will try to arrange for the patient to receive Advair prior to discharge from hospital. We have also set him up with a primary care physician. He's been strongly advised to abstain from any tobacco or other respiratory irritants.   Bronchitis, acute  GERD (gastroesophageal reflux disease)  Tobacco abuse   Time spent on Discharge:  Signed: MEMON,JEHANZEB 01/29/2011, 10:58 AM

## 2011-01-31 NOTE — ED Provider Notes (Signed)
I saw and evaluated the patient, reviewed the resident's note and I agree with the findings and plan.   Glynn Octave, MD 01/31/11 1014

## 2011-07-14 ENCOUNTER — Emergency Department (HOSPITAL_COMMUNITY)
Admission: EM | Admit: 2011-07-14 | Discharge: 2011-07-14 | Disposition: A | Discharge status: Home / Self Care | Payer: Self-pay | Attending: Emergency Medicine | Admitting: Emergency Medicine

## 2011-07-14 ENCOUNTER — Encounter (HOSPITAL_COMMUNITY): Payer: Self-pay

## 2011-07-14 DIAGNOSIS — R0602 Shortness of breath: Secondary | ICD-10-CM | POA: Insufficient documentation

## 2011-07-14 DIAGNOSIS — R079 Chest pain, unspecified: Secondary | ICD-10-CM | POA: Insufficient documentation

## 2011-07-14 DIAGNOSIS — F410 Panic disorder [episodic paroxysmal anxiety] without agoraphobia: Secondary | ICD-10-CM | POA: Insufficient documentation

## 2011-07-14 DIAGNOSIS — J45909 Unspecified asthma, uncomplicated: Secondary | ICD-10-CM | POA: Insufficient documentation

## 2011-07-14 MED ORDER — ALBUTEROL SULFATE (5 MG/ML) 0.5% IN NEBU
5.0000 mg | INHALATION_SOLUTION | Freq: Once | RESPIRATORY_TRACT | Status: AC
  Administered 2011-07-14: 5 mg via RESPIRATORY_TRACT
  Filled 2011-07-14: qty 1

## 2011-07-14 MED ORDER — LORAZEPAM 1 MG PO TABS
1.0000 mg | ORAL_TABLET | Freq: Once | ORAL | Status: AC
  Administered 2011-07-14: 1 mg via ORAL
  Filled 2011-07-14: qty 1

## 2011-07-14 MED ORDER — PREDNISONE 20 MG PO TABS
60.0000 mg | ORAL_TABLET | Freq: Once | ORAL | Status: AC
  Administered 2011-07-14: 60 mg via ORAL
  Filled 2011-07-14: qty 3

## 2011-07-14 MED ORDER — IPRATROPIUM BROMIDE 0.02 % IN SOLN: 0.5000 mg | Freq: Once | RESPIRATORY_TRACT | Status: DC

## 2011-07-14 MED ORDER — PREDNISONE 10 MG PO TABS: 20.0000 mg | ORAL_TABLET | Freq: Every day | ORAL | Status: DC

## 2011-07-14 NOTE — ED Notes (Signed)
Per EMS:  Patient reporting left sided chest pain sudden onset this AM [place] the jail; patient reporting anxiety during the episode of pain.  Patient has has 3 SL nitro, 324 ASA pta.

## 2011-07-14 NOTE — ED Notes (Signed)
Pt presents to department for L sided non radiating chest pain, sudden onset this morning while in custody at jail. 6/10 pain upon arrival. Also states he feels like his heart is "racing" and did experience SOB at onset of symptoms. Respirations unlabored at the time. Lung sounds clear and equal bilaterally. Skin warm and dry. Pt conscious alert and oriented x4. States this has happened to him before. No signs of acute distress at the time.

## 2011-07-14 NOTE — ED Provider Notes (Signed)
History     CSN: [id number]  Arrival date & time 07/14/11  1032   First MD Initiated Contact with Patient 07/14/11 1105      Chief Complaint  Patient presents with  . Chest Pain    (Consider location/radiation/quality/duration/timing/severity/associated sxs/prior treatment) HPI  36 year old male with history of asthma presents with a chief complaint of panic attacks. Patient states he was in the process of been process for jail when he experiencing an anxiety attack. Described as a throbbing and hollow sensation to his left chest with associated shortness of breath and heart palpitations. Patient state he felt very unease, similar to his prior panic attack. Symptoms lasted for 30 minutes and 2 EMS arrived. He did receive 3 sublingual nitroglycerin and aspirin prior to arrival. States it helps his symptoms somewhat. He denies fever, productive cough, and abdominal pain, vomiting, or diarrhea. Patient states he has been under a lot of stress lately, also mentioned that he's been drinking caffeine which seems to worsen his heart palpitation. He is currently chest pain-free.   Past Medical History  Diagnosis Date  . Asthma     History reviewed. No pertinent past surgical history.  No family history on file.  History  Substance Use Topics  . Smoking status: Former Games developer  . Smokeless tobacco: Never Used  . Alcohol Use: Yes     occasional beer      Review of Systems  All other systems reviewed and are negative.    Allergies  Peanut-containing drug products  Home Medications   Current Outpatient Rx  Name Route Sig Dispense Refill  . ALBUTEROL SULFATE HFA 108 (90 BASE) MCG/ACT IN AERS Inhalation Inhale 2 puffs into the lungs every 4 (four) hours as needed for wheezing. 1 Inhaler 0    BP 110/82  Pulse 69  Temp(Src) 98.1 F (36.7 C) (Oral)  Resp 16  SpO2 98%  Physical Exam  Nursing note and vitals reviewed. Constitutional: He appears well-developed and  well-nourished. No distress.       Awake, alert, nontoxic appearance  HENT:  Head: Atraumatic.  Eyes: Conjunctivae are normal. Right eye exhibits no discharge. Left eye exhibits no discharge.  Neck: Normal range of motion. Neck supple.  Cardiovascular: Normal rate and regular rhythm.   Pulmonary/Chest: Effort normal. No respiratory distress. He has wheezes. He exhibits no tenderness.       mild inspiratory and expiratory  wheezes  Abdominal: Soft. There is no tenderness. There is no rebound.  Musculoskeletal: He exhibits no tenderness.       ROM appears intact, no obvious focal weakness  Neurological: He is alert.  Skin: Skin is warm and dry. No rash noted.  Psychiatric: He has a normal mood and affect.    ED Course  Procedures (including critical care time)  Labs Reviewed - No data to display No results found.   No diagnosis found.   Date: 07/14/2011  Rate: 70  Rhythm: normal sinus rhythm  QRS Axis: normal  Intervals: normal  ST/T Wave abnormalities: normal  Conduction Disutrbances:none  Narrative Interpretation: improves from prior ECG on 28-Jan-2011  Old EKG Reviewed: changes noted    MDM  Hx of panic attack with recent cp and anxiety attack while being booked for jail.  Currently CP free, ECG unremarkable.  Hx asthma, lung exam remarkable for inspiratory and expiratory wheezes.  Albuterol/atrovent nebs given with prednisone.  Ativan given for anxiety.    11:55 AM Patient felt better after breathing treatments and Ativan. Lungs clear  to auscultations bilaterally. Pain to discharge patient with steroid taper course and for him to continue with his albuterol at home. Patient voiced understanding. Discussed care with my attending.     Fayrene Helper, PA-C 07/14/11 1157

## 2011-07-14 NOTE — Discharge Instructions (Signed)
Anxiety and Panic Attacks Your caregiver has informed you that you are having an anxiety or panic attack. There may be many forms of this. Most of the time these attacks come suddenly and without warning. They come at any time of day, including periods of sleep, and at any time of life. They may be strong and unexplained. Although panic attacks are very scary, they are physically harmless. Sometimes the cause of your anxiety is not known. Anxiety is a protective mechanism of the body in its fight or flight mechanism. Most of these perceived danger situations are actually nonphysical situations (such as anxiety over losing a job). CAUSES  The causes of an anxiety or panic attack are many. Panic attacks may occur in otherwise healthy people given a certain set of circumstances. There may be a genetic cause for panic attacks. Some medications may also have anxiety as a side effect. SYMPTOMS  Some of the most common feelings are:  Intense terror.   Dizziness, feeling faint.   Hot and cold flashes.   Fear of going crazy.   Feelings that nothing is real.   Sweating.   Shaking.   Chest pain or a fast heartbeat (palpitations).   Smothering, choking sensations.   Feelings of impending doom and that death is near.   Tingling of extremities, this may be from over-breathing.   Altered reality (derealization).   Being detached from yourself (depersonalization).  Several symptoms can be present to make up anxiety or panic attacks. DIAGNOSIS  The evaluation by your caregiver will depend on the type of symptoms you are experiencing. The diagnosis of anxiety or panic attack is made when no physical illness can be determined to be a cause of the symptoms. TREATMENT  Treatment to prevent anxiety and panic attacks may include:  Avoidance of circumstances that cause anxiety.   Reassurance and relaxation.   Regular exercise.   Relaxation therapies, such as yoga.   Psychotherapy with a  psychiatrist or therapist.   Avoidance of caffeine, alcohol and illegal drugs.   Prescribed medication.  SEEK IMMEDIATE MEDICAL CARE IF:   You experience panic attack symptoms that are different than your usual symptoms.   You have any worsening or concerning symptoms.  Document Released: 02/24/2005 Document Revised: 02/13/2011 Document Reviewed: 06/28/2009 [facility] Patient Information 2012 [place], [place].  Asthma Prevention Cigarette smoke, house dust, molds, pollens, animal dander, certain insects, exercise, and even cold air are all triggers that can cause an asthma attack. Often, no specific triggers are identified.  Take the following measures around your house to reduce attacks:  Avoid cigarette and other smoke. No smoking should be allowed in a home where someone with asthma lives. If smoking is allowed indoors, it should be done in a room with a closed door, and a window should be opened to clear the air. If possible, do not use a wood-burning stove, kerosene heater, or fireplace. Minimize exposure to all sources of smoke, including incense, candles, fires, and fireworks.   Decrease pollen exposure. Keep your windows shut and use central air during the pollen allergy season. Stay indoors with windows closed from late morning to afternoon, if you can. Avoid mowing the lawn if you have grass pollen allergy. Change your clothes and shower after being outside during this time of year.   Remove molds from bathrooms and wet areas. Do this by cleaning the floors with a fungicide or diluted bleach. Avoid using humidifiers, vaporizers, or swamp coolers. These can spread molds through the air.  Fix leaky faucets, pipes, or other sources of water that have mold around them.   Decrease house dust exposure. Do this by using bare floors, vacuuming frequently, and changing furnace and air cooler filters frequently. Avoid using feather, wool, or foam bedding. Use polyester pillows and plastic covers  over your mattress. Wash bedding weekly in hot water (hotter than 130 F).   Try to get someone else to vacuum for you once or twice a week, if you can. Stay out of rooms while they are being vacuumed and for a short while afterward. If you vacuum, use a dust mask (from a hardware store), a double-layered or microfilter vacuum cleaner bag, or a vacuum cleaner with a HEPA filter.   Avoid perfumes, talcum powder, hair spray, paints and other strong odors and fumes.   Keep warm-blooded pets (cats, dogs, rodents, birds) outside the home if they are triggers for asthma. If you can't keep the pet outdoors, keep the pet out of your bedroom and other sleeping areas at all times, and keep the door closed. Remove carpets and furniture covered with cloth from your home. If that is not possible, keep the pet away from fabric-covered furniture and carpets.   Eliminate cockroaches. Keep food and garbage in closed containers. Never leave food out. Use poison baits, traps, powders, gels, or paste (for example, boric acid). If a spray is used to kill cockroaches, stay out of the room until the odor goes away.   Decrease indoor humidity to less than 60%. Use an indoor air cleaning device.   Avoid sulfites in foods and beverages. Do not drink beer or wine or eat dried fruit, processed potatoes, or shrimp if they cause asthma symptoms.   Avoid cold air. Cover your nose and mouth with a scarf on cold or windy days.   Avoid aspirin. This is the most common drug causing serious asthma attacks.   If exercise triggers your asthma, ask your caregiver how you should prepare before exercising. (For example, ask if you could use your inhaler 10 minutes before exercising.)   Avoid close contact with people who have a cold or the flu since your asthma symptoms may get worse if you catch the infection from them. Wash your hands thoroughly after touching items that may have been handled by others with a respiratory infection.     Get a flu shot every year to protect against the flu virus, which often makes asthma worse for days to weeks. Also get a pneumonia shot once every five to 10 years.  Call your caregiver if you want further information about measures you can take to help prevent asthma attacks. Document Released: 02/24/2005 Document Revised: 02/13/2011 Document Reviewed: 01/02/2009 [facility] Patient Information 2012 [place], [place].

## 2011-07-16 NOTE — ED Provider Notes (Signed)
Medical screening examination/treatment/procedure(s) were performed by non-physician practitioner and as supervising physician I was immediately available for consultation/collaboration.    Carl Tesfaye L Takina Busser, MD 07/16/11 0713 

## 2011-09-12 ENCOUNTER — Emergency Department (INDEPENDENT_AMBULATORY_CARE_PROVIDER_SITE_OTHER)
Admission: EM | Admit: 2011-09-12 | Discharge: 2011-09-12 | Disposition: A | Discharge status: Home / Self Care | Payer: Self-pay | Source: Home / Self Care | Attending: Emergency Medicine | Admitting: Emergency Medicine

## 2011-09-12 ENCOUNTER — Encounter (HOSPITAL_COMMUNITY): Payer: Self-pay | Admitting: *Deleted

## 2011-09-12 DIAGNOSIS — J020 Streptococcal pharyngitis: Secondary | ICD-10-CM

## 2011-09-12 MED ORDER — PENICILLIN V POTASSIUM 500 MG PO TABS: 500.0000 mg | ORAL_TABLET | Freq: Four times a day (QID) | ORAL | Status: AC

## 2011-09-12 MED ORDER — ACETAMINOPHEN-CODEINE #3 300-30 MG PO TABS: 1.0000 | ORAL_TABLET | Freq: Four times a day (QID) | ORAL | Status: AC | PRN

## 2011-09-12 NOTE — ED Notes (Signed)
Carl Tucker  Has  Symptoms  Of  sorethroat  With  Congested      And  Pain  When  He  Swallows  Pt  Reports  Symptoms  Began this  Am   -    He  Is  Sitting  Upright on  Exam table   Speaking in  Complete  sentances  And  Appears  In no  Acute  Distress

## 2011-09-12 NOTE — ED Provider Notes (Signed)
History     CSN: [id number]  Arrival date & time 09/12/11  1458   First MD Initiated Contact with Patient 09/12/11 1603      Chief Complaint  Patient presents with  . Sore Throat    (Consider location/radiation/quality/duration/timing/severity/associated sxs/prior treatment) HPI Comments: Side of a sore throat and congested since last night worse this a.m. Unable to swallow fluids and solids. No fevers or bodyaches no headaches, no respiratory symptoms. Mild nasal congestion as well. "Sore throat just got worse this morning decided to come in to have it checked"  Patient is a 36 y.o. male presenting with pharyngitis. The history is provided by the patient.  Sore Throat This is a new problem. The problem occurs constantly. The problem has not changed since onset.Pertinent negatives include no chest pain, no abdominal pain and no shortness of breath. The symptoms are aggravated by swallowing. He has tried nothing for the symptoms.    Past Medical History  Diagnosis Date  . Asthma     History reviewed. No pertinent past surgical history.  No family history on file.  History  Substance Use Topics  . Smoking status: Current Everyday Smoker  . Smokeless tobacco: Never Used  . Alcohol Use: Yes     occasional beer      Review of Systems  Constitutional: Positive for appetite change. Negative for fever, chills, activity change and unexpected weight change.  HENT: Positive for sore throat. Negative for ear pain, facial swelling, rhinorrhea, trouble swallowing, neck pain, voice change, postnasal drip, sinus pressure and ear discharge.   Respiratory: Negative for shortness of breath.   Cardiovascular: Negative for chest pain.  Gastrointestinal: Negative for abdominal pain.  Genitourinary: Negative for dysuria, urgency, frequency, decreased urine volume, penile pain and testicular pain.  Musculoskeletal: Negative for back pain.  Skin: Negative for rash and wound.  Neurological:  Negative for dizziness.    Allergies  Peanut-containing drug products  Home Medications   Current Outpatient Rx  Name Route Sig Dispense Refill  . ACETAMINOPHEN-CODEINE #3 300-30 MG PO TABS Oral Take 1-2 tablets by mouth every 6 (six) hours as needed for pain. 15 tablet 0  . ALBUTEROL SULFATE HFA 108 (90 BASE) MCG/ACT IN AERS Inhalation Inhale 2 puffs into the lungs every 4 (four) hours as needed for wheezing. 1 Inhaler 0  . PENICILLIN V POTASSIUM 500 MG PO TABS Oral Take 1 tablet (500 mg total) by mouth 4 (four) times daily. 30 tablet 0  . PREDNISONE 10 MG PO TABS Oral Take 2 tablets (20 mg total) by mouth daily. 15 tablet 0    BP 133/77  Pulse 84  Temp 100 F (37.8 C) (Oral)  Resp 16  SpO2 100%  Physical Exam  Nursing note and vitals reviewed. Constitutional: He appears well-developed and well-nourished.  HENT:  Right Ear: Tympanic membrane normal.  Left Ear: Tympanic membrane normal.  Mouth/Throat: Uvula is midline and mucous membranes are normal. Posterior oropharyngeal erythema present. No oropharyngeal exudate or tonsillar abscesses.  Eyes: Conjunctivae and EOM are normal. Pupils are equal, round, and reactive to light. No foreign bodies found.  Neck: Trachea normal. No JVD present. No tracheal deviation present. No Brudzinski's sign and no Kernig's sign noted. No thyromegaly present.  Pulmonary/Chest: Effort normal and breath sounds normal. He has no decreased breath sounds.  Abdominal: Soft. He exhibits no distension. There is no tenderness.  Skin: Skin is warm. No abrasion noted. No erythema.    ED Course  Procedures (including critical care  time)  Labs Reviewed  POCT RAPID STREP A ([facility]RG CARE ONLY) - Abnormal; Notable for the following:    Streptococcus, Group A Screen (Direct) POSITIVE (*)     All other components within normal limits   No results found.   1. Strep pharyngitis       MDM   Uncomplicated- streptococcal pharyngitis. No signs of any  pharyngeal or peritonsillar abscess patient was prescribed a ten-day course of penicillin with Tylenol #3 for discomfort and myalgias.       Jimmie Molly, MD 09/12/11 2051

## 2013-08-05 IMAGING — CR DG CHEST 2V
2 series · 2 of 2 positions shown · non-contrast
Comparison: 01/08/2005

CLINICAL DATA: Wheezing.  History of asthma.

CHEST - 2 VIEW

[w chest pa]
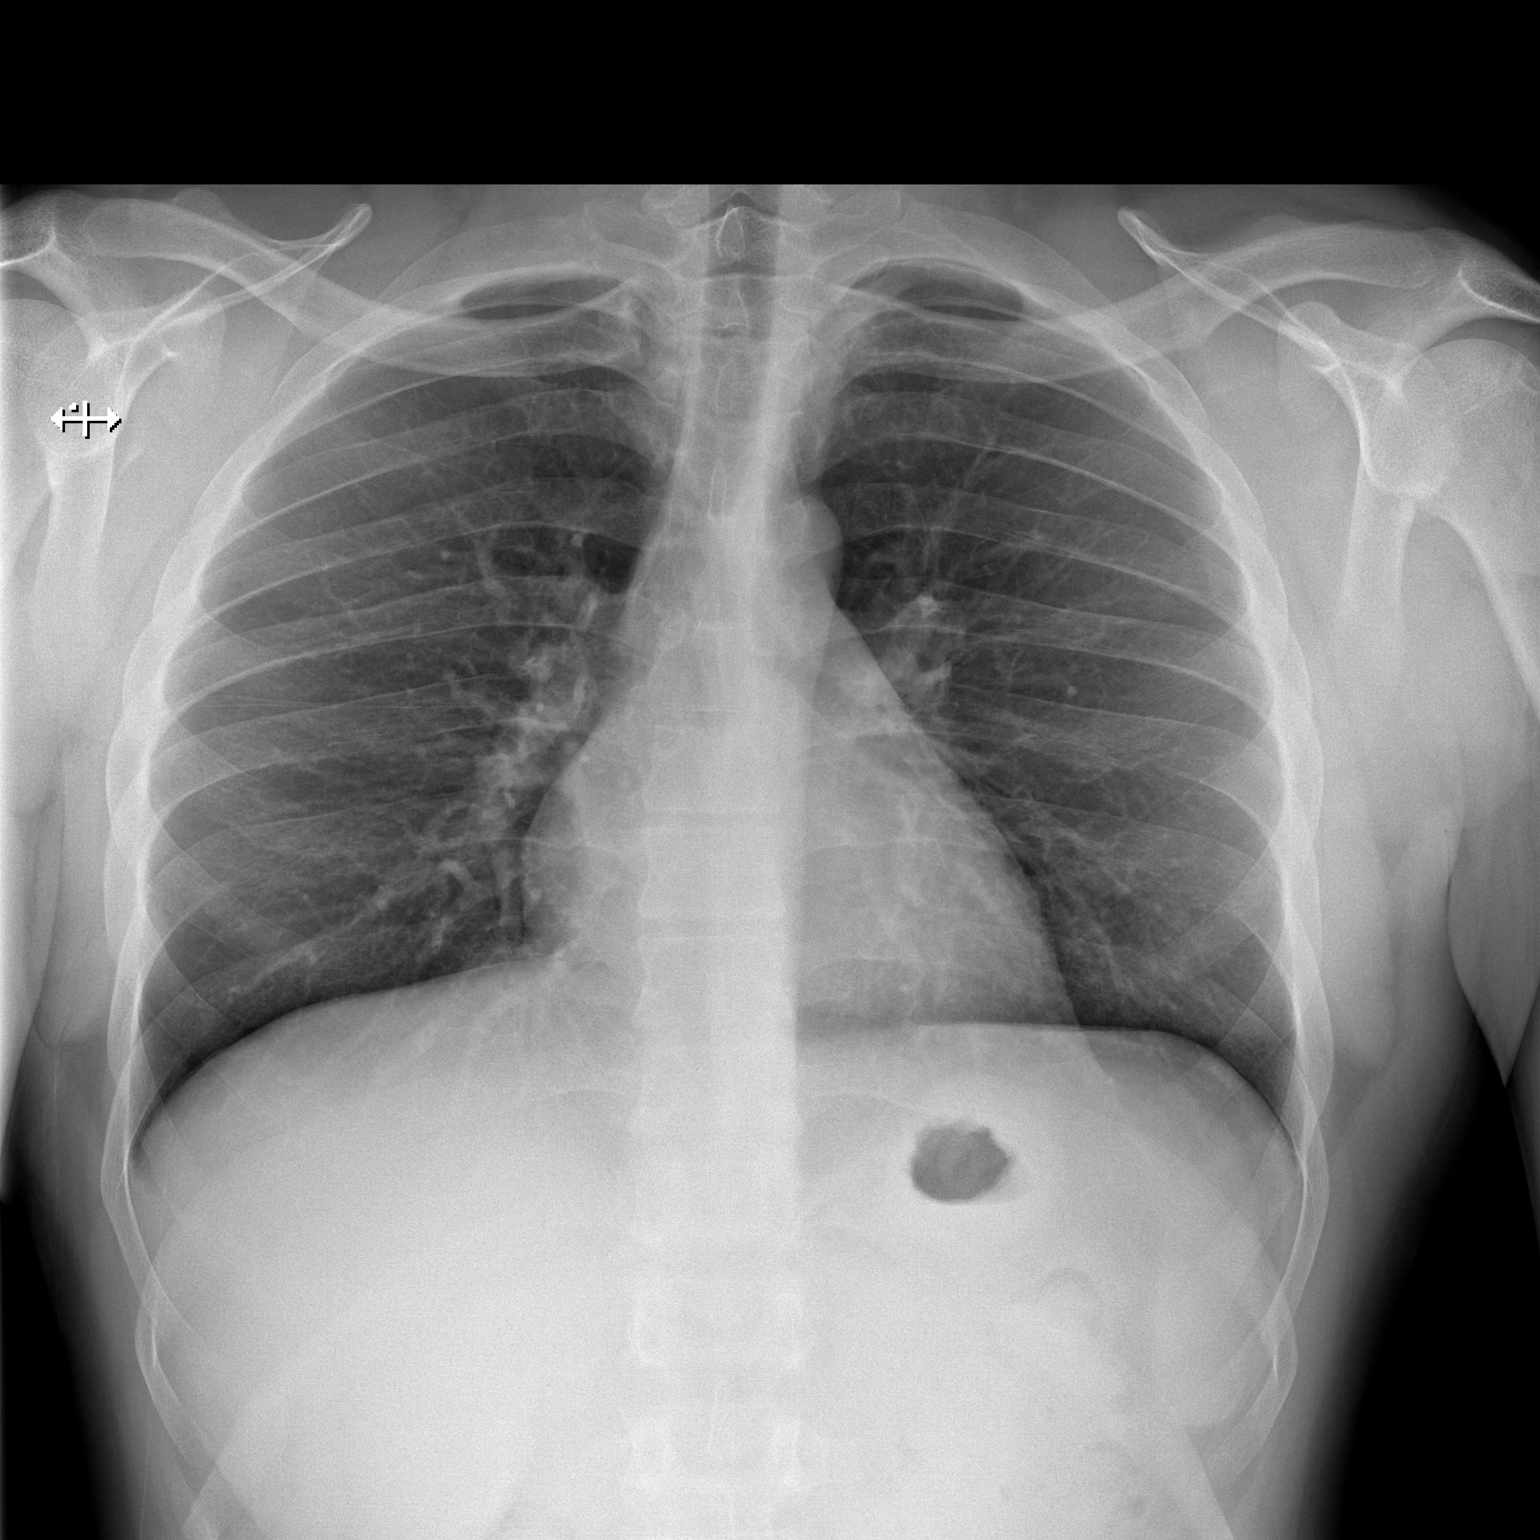

[w chest lat]
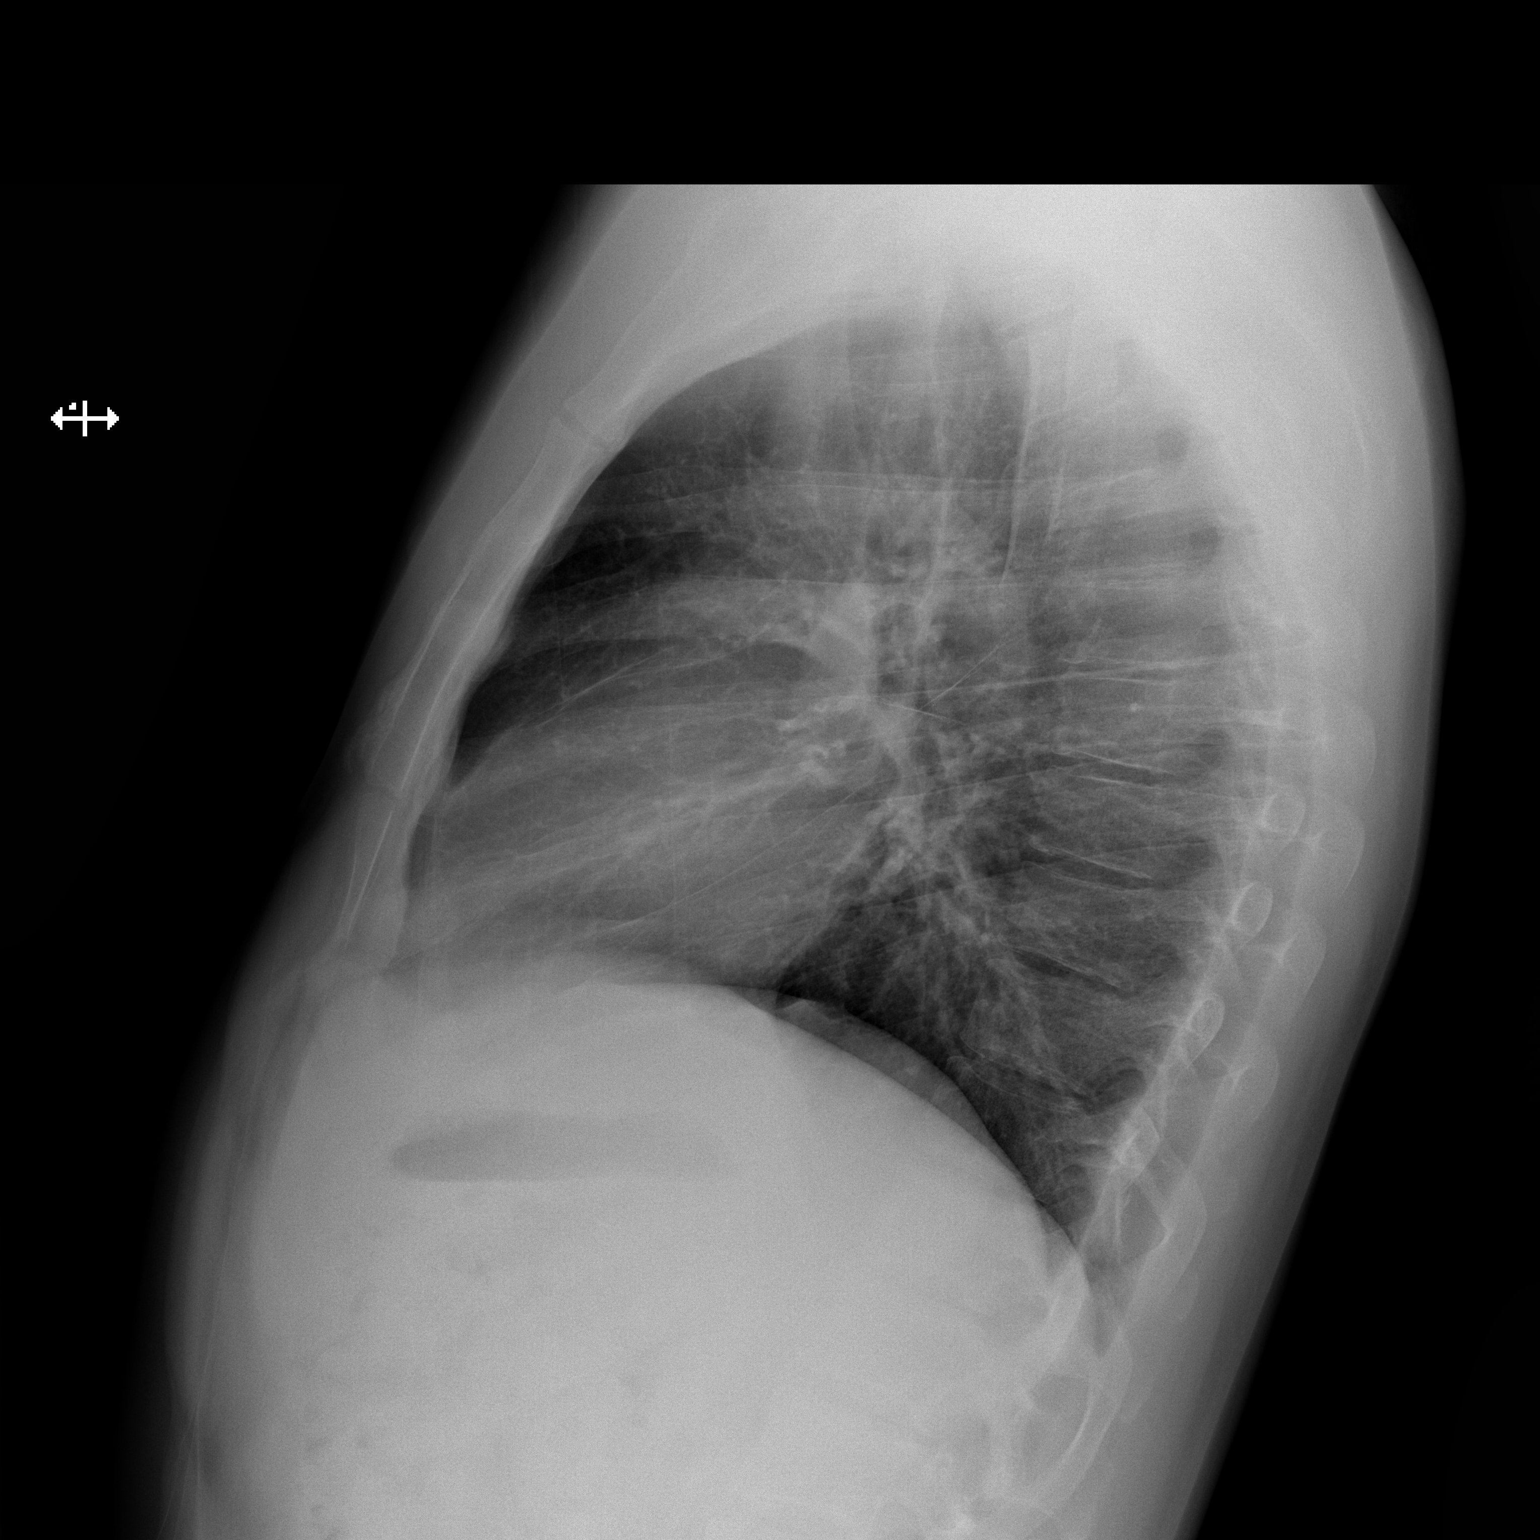

[2 of 2 positions shown; findings below may reference images not displayed]

FINDINGS: Midline trachea.  Normal heart size and mediastinal
contours. No pleural effusion or pneumothorax.  Clear lungs.
IMPRESSION: No acute cardiopulmonary disease.

## 2016-09-29 ENCOUNTER — Encounter (HOSPITAL_COMMUNITY): Payer: Self-pay | Admitting: Emergency Medicine

## 2016-09-29 ENCOUNTER — Emergency Department (HOSPITAL_COMMUNITY)
Admission: EM | Admit: 2016-09-29 | Discharge: 2016-09-29 | Disposition: A | Discharge status: Home / Self Care | Payer: Self-pay | Attending: Emergency Medicine | Admitting: Emergency Medicine

## 2016-09-29 ENCOUNTER — Emergency Department (HOSPITAL_COMMUNITY): Payer: Self-pay

## 2016-09-29 DIAGNOSIS — J069 Acute upper respiratory infection, unspecified: Secondary | ICD-10-CM | POA: Insufficient documentation

## 2016-09-29 DIAGNOSIS — J45901 Unspecified asthma with (acute) exacerbation: Secondary | ICD-10-CM | POA: Insufficient documentation

## 2016-09-29 DIAGNOSIS — F17200 Nicotine dependence, unspecified, uncomplicated: Secondary | ICD-10-CM | POA: Insufficient documentation

## 2016-09-29 DIAGNOSIS — Z91010 Allergy to peanuts: Secondary | ICD-10-CM | POA: Insufficient documentation

## 2016-09-29 MED ORDER — PREDNISONE 10 MG PO TABS: 20.0000 mg | ORAL_TABLET | Freq: Every day | ORAL | 0 refills | Status: DC

## 2016-09-29 MED ORDER — IPRATROPIUM-ALBUTEROL 0.5-2.5 (3) MG/3ML IN SOLN
3.0000 mL | Freq: Once | RESPIRATORY_TRACT | Status: AC
  Administered 2016-09-29: 3 mL via RESPIRATORY_TRACT
  Filled 2016-09-29: qty 3

## 2016-09-29 MED ORDER — ALBUTEROL SULFATE HFA 108 (90 BASE) MCG/ACT IN AERS: 2.0000 | INHALATION_SPRAY | RESPIRATORY_TRACT | 0 refills | Status: DC | PRN

## 2016-09-29 MED ORDER — ALBUTEROL SULFATE HFA 108 (90 BASE) MCG/ACT IN AERS
2.0000 | INHALATION_SPRAY | Freq: Once | RESPIRATORY_TRACT | Status: DC
Start: 2016-09-29 — End: 2016-09-29
  Filled 2016-09-29: qty 6.7

## 2016-09-29 NOTE — ED Triage Notes (Signed)
Per pt, states cough, congestion, post nasal drip for 2 weeks-states he wants to make sure her doesn't have PNA-states coughing spells from the post nasal drip

## 2016-09-29 NOTE — ED Notes (Signed)
Patient is alert and oriented x3.  He was given DC instructions and follow up visit instructions.  Patient gave verbal understanding.  He was DC ambulatory under his own power to home.  V/S stable.  He was not showing any signs of distress on DC 

## 2016-09-29 NOTE — ED Notes (Signed)
Bed: WTR8 Expected date:  Expected time:  Means of arrival:  Comments: 

## 2016-09-29 NOTE — ED Notes (Signed)
Bed: WTR9 Expected date:  Expected time:  Means of arrival:  Comments: 

## 2016-09-29 NOTE — Discharge Instructions (Signed)
Prednisone as directed.  Use inhaler as directed.  Follow-up with one of the referred clinics for further primary care evaluation. As we discussed, follow-up with them so that he can potentially get a nebulizer machine to help with her asthma.  Return to the emergency department for any worsening chest pain, dental to breathing, fever or any other worsening or concerning symptoms.  If you do not have a primary care doctor you see regularly, please you the list below. Please call them to arrange for follow-up.    No Primary Care Doctor Call [facility]  [phone] Other agencies that provide inexpensive medical care    [facility]  [phone]    [facility]  [phone]    [facility]  [phone]    [facility]  [phone]    [facility]  [phone]    [facility]  [phone]

## 2016-09-29 NOTE — ED Provider Notes (Signed)
[facility]-EMERGENCY DEPT Provider Note   CSN: [id number] Arrival date & time: 09/29/16  8657  By signing my name below, I, Diona Browner, attest that this documentation has been prepared under the direction and in the presence of Graciella Freer, PA-C. Electronically Signed: Diona Browner, ED Scribe. 09/29/16. 11:14 AM.  History   Chief Complaint Chief Complaint  Patient presents with  . Nasal Congestion    HPI Carl Tucker is a 41 y.o. male who presents to the Emergency Department complaining of nasal congestion and cough for the last 2 weeks. He reports that the cough is intermittently productive with mostly clear sputum and a few episodes of green. He reports associated post nasal drip, rhinorrhea. He also has experiences some SOB associated with his asthma. He took Nyquil with mild relief . He has used his albuterol inhaler with mild relief.. Smokes ~ .5 pack per 2 days. Pt is concerned he might have PNA. Pt denies fever, CP,  or any other complaints at this time.   The history is provided by the patient. No language interpreter was used.    Past Medical History:  Diagnosis Date  . Asthma   . Bronchitis     Patient Active Problem List   Diagnosis Date Noted  . Bronchitis, acute 01/29/2011  . GERD (gastroesophageal reflux disease) 01/29/2011  . Tobacco abuse 01/29/2011  . Asthma exacerbation 01/28/2011    Class: Acute    No past surgical history on file.     Home Medications    Prior to Admission medications   Medication Sig Start Date End Date Taking? Authorizing Provider  albuterol (PROVENTIL HFA;VENTOLIN HFA) 108 (90 Base) MCG/ACT inhaler Inhale 2 puffs into the lungs every 4 (four) hours as needed for wheezing or shortness of breath. 09/29/16   Maxwell Caul, PA-C  predniSONE (DELTASONE) 10 MG tablet Take 2 tablets (20 mg total) by mouth daily. 09/29/16   Maxwell Caul, PA-C    Family History No family history on file.  Social History Social History    Substance Use Topics  . Smoking status: Current Every Day Smoker  . Smokeless tobacco: Never Used  . Alcohol use Yes     Comment: occasional beer     Allergies   Peanut-containing drug products   Review of Systems Review of Systems  Constitutional: Negative for fever.  HENT: Positive for congestion, postnasal drip, rhinorrhea and sore throat.   Respiratory: Positive for cough and shortness of breath.   Cardiovascular: Negative for chest pain.     Physical Exam Updated Vital Signs BP (!) 140/96 (BP Location: Right Arm)   Pulse 69   Temp 98.6 F (37 C) (Oral)   Ht 5\' 9"  (1.753 m)   Wt 83.9 kg (185 lb)   SpO2 95%   BMI 27.32 kg/m   Physical Exam  Constitutional: He appears well-developed and well-nourished.  Sitting comfortably on examination table  HENT:  Head: Normocephalic and atraumatic.  Mouth/Throat: Oropharynx is clear and moist.  Throat is clear. Nasal congestion.   Eyes: Conjunctivae and EOM are normal. Right eye exhibits no discharge. Left eye exhibits no discharge. No scleral icterus.  Pulmonary/Chest: Effort normal. He has wheezes. He has no rales.  Diffuse wheezing throughout all lung fields. No evidence of rales.  No evidence of respiratory distress. Able to speak in full sentences without difficulty.  Neurological: He is alert.  Skin: Skin is warm and dry.  Psychiatric: He has a normal mood and affect. His speech is  normal and behavior is normal.  Nursing note and vitals reviewed.    ED Treatments / Results  DIAGNOSTIC STUDIES: Oxygen Saturation is 95% on RA, normal by my interpretation.   COORDINATION OF CARE: 11:14 AM-Discussed next steps with pt which includes a breathing treatment and a CXR. Pt verbalized understanding and is agreeable with the plan.   Labs (all labs ordered are listed, but only abnormal results are displayed) Labs Reviewed - No data to display  EKG  EKG Interpretation None       Radiology Dg Chest 2  View  Result Date: 09/29/2016 CLINICAL DATA:  Current history of asthma presenting with a 2 week history of cough, chest congestion and postnasal drip. EXAM: CHEST  2 VIEW COMPARISON:  01/28/2011, 01/08/2005. FINDINGS: Cardiomediastinal silhouette unremarkable, unchanged. Lungs clear. Bronchovascular markings normal. Pulmonary vascularity normal. No visible pleural effusions. No pneumothorax. Visualized bony thorax intact. IMPRESSION: Normal examination. Electronically Signed   By: Hulan Saashomas  Lawrence M.D.   On: 09/29/2016 12:06    Procedures Procedures (including critical care time)  Medications Ordered in ED Medications  albuterol (PROVENTIL HFA;VENTOLIN HFA) 108 (90 Base) MCG/ACT inhaler 2 puff (not administered)  ipratropium-albuterol (DUONEB) 0.5-2.5 (3) MG/3ML nebulizer solution 3 mL (3 mLs Nebulization Given 09/29/16 1133)     Initial Impression / Assessment and Plan / ED Course  I have reviewed the triage vital signs and the nursing notes.  Pertinent labs & imaging results that were available during my care of the patient were reviewed by me and considered in my medical decision making (see chart for details).     41 year old male with past medical history of asthma who presents with 2 weeks of nasal congestion and cough. Patient is afebrile, non-toxic appearing, sitting comfortably on examination table. Vital signs reviewed and stable. O2 sat is 95% on room air. No evidence of respiratory distress. Consider URI versus bronchitis versus asthma exacerbation versus pneumonia. Low suspicion of pneumonia given history/physical exam. Physical exam shows some diffuse wheezing throughout all lung fields. Will plan to do a nebulizer treatment here in the department. Will obtain chest x-ray for evaluation of acute infectious etiology.  Reevaluation after nebulizer treatment. Wheezing has improved significantly. Patient states that he feels better after the nebulizer treatment was able to cough  mucus up. Chest x-ray is negative for any acute infectious etiology. Discussed results with patient. Explained that this is most likely a URI/asthma exacerbation most likely caused by a viral source. Given that it has been going on for 2 weeks we discussed the use of antibiotics in his treatment. Patient does not wish to have any antibiotics at this time, which I feel is reasonable given reassuring vitals and physical exam. Will plan to send him home with a prednisone burst for symptomatically. Patient provided albuterol inhaler in the department for symptomatically. Also plan to provide prescription for albuterol inhaler. Provided patient with a list of clinic resources to use if he does not have a PCP. Instructed to call them today to arrange follow-up in the next 24-48 hours.  Return precautions discussed. Patient expresses understanding and agreement to plan.    Final Clinical Impressions(s) / ED Diagnoses   Final diagnoses:  Upper respiratory tract infection, unspecified type  Exacerbation of asthma, unspecified asthma severity, unspecified whether persistent    New Prescriptions New Prescriptions   ALBUTEROL (PROVENTIL HFA;VENTOLIN HFA) 108 (90 BASE) MCG/ACT INHALER    Inhale 2 puffs into the lungs every 4 (four) hours as needed for wheezing or shortness  of breath.   PREDNISONE (DELTASONE) 10 MG TABLET    Take 2 tablets (20 mg total) by mouth daily.   I personally performed the services described in this documentation, which was scribed in my presence. The recorded information has been reviewed and is accurate.     Maxwell Caul, PA-C 09/29/16 1227    Cathren Laine, MD 09/30/16 [phone]

## 2016-10-07 ENCOUNTER — Emergency Department (HOSPITAL_COMMUNITY)
Admission: EM | Admit: 2016-10-07 | Discharge: 2016-10-07 | Disposition: A | Discharge status: Home / Self Care | Payer: Self-pay | Attending: Emergency Medicine | Admitting: Emergency Medicine

## 2016-10-07 ENCOUNTER — Encounter (HOSPITAL_COMMUNITY): Payer: Self-pay | Admitting: Emergency Medicine

## 2016-10-07 ENCOUNTER — Emergency Department (HOSPITAL_COMMUNITY): Payer: Self-pay

## 2016-10-07 DIAGNOSIS — J45909 Unspecified asthma, uncomplicated: Secondary | ICD-10-CM | POA: Insufficient documentation

## 2016-10-07 DIAGNOSIS — R0781 Pleurodynia: Secondary | ICD-10-CM

## 2016-10-07 DIAGNOSIS — F17200 Nicotine dependence, unspecified, uncomplicated: Secondary | ICD-10-CM | POA: Insufficient documentation

## 2016-10-07 DIAGNOSIS — R0789 Other chest pain: Secondary | ICD-10-CM | POA: Insufficient documentation

## 2016-10-07 MED ORDER — METHOCARBAMOL 500 MG PO TABS: 500.0000 mg | ORAL_TABLET | Freq: Two times a day (BID) | ORAL | 0 refills | Status: DC | PRN

## 2016-10-07 NOTE — Discharge Instructions (Signed)
It was my pleasure taking care of you today!   Continue Aleve as needed for pain. Robaxin is your muscle relaxer to take as needed.   Follow up with your primary care provider if symptoms do not improve. Information for finding a primary care physician is listed below if you need one.   Return to ER for new or worsening symptoms, any additional concerns.   To find a primary care or specialty doctor please call [phone] or [phone] to access "[facility] Find a Doctor Service."  You may also go on the [facility] website at [email or web address]  There are also multiple [facility], [place] and [facility] practices throughout the [place] that are frequently accepting new patients. You may find a clinic that is close to your home and contact them.  [facility] - [address] [place] [phone]  [facility] in [place] (also locations in [place] and [place]) - [address] [place] [phone]  [facility] - [address] [place] [phone]

## 2016-10-07 NOTE — ED Provider Notes (Signed)
[facility]-EMERGENCY DEPT Provider Note   CSN: [id number] Arrival date & time: 10/07/16  [id number]   By signing my name below, I, Clarisse GougeXavier Herndon, attest that this documentation has been prepared under the direction and in the presence of [facility] HospitalJaime Ward, PA-C. Electronically signed, Clarisse GougeXavier Herndon, ED Scribe. 10/07/16. 11:20 AM.   History   Chief Complaint Chief Complaint  Patient presents with  . Rib Injury   The history is provided by the patient and medical records. No language interpreter was used.    Carl Tucker is a 41 y.o. male with h/o asthma presenting to the Emergency Department for gradually worsening right rib pain x 4 days. Pt states he felt a muscle cramp to the aforementioned area while playing basketball 3 days ago, and he adds the pain never went away. 8/10, constant sharp pain and soreness described; he states this is worse with application of pressure and contact to the area. No known direct trauma/ injury. Recent URI symptoms which have now resolved. No cough, fever or any other complaints noted at this time.   Past Medical History:  Diagnosis Date  . Asthma   . Bronchitis     Patient Active Problem List   Diagnosis Date Noted  . Bronchitis, acute 01/29/2011  . GERD (gastroesophageal reflux disease) 01/29/2011  . Tobacco abuse 01/29/2011  . Asthma exacerbation 01/28/2011    Class: Acute    History reviewed. No pertinent surgical history.     Home Medications    Prior to Admission medications   Medication Sig Start Date End Date Taking? Authorizing Provider  albuterol (PROVENTIL HFA;VENTOLIN HFA) 108 (90 Base) MCG/ACT inhaler Inhale 2 puffs into the lungs every 4 (four) hours as needed for wheezing or shortness of breath. 09/29/16   Maxwell CaulLayden, Lindsey A, PA-C  methocarbamol (ROBAXIN) 500 MG tablet Take 1 tablet (500 mg total) by mouth 2 (two) times daily as needed for muscle spasms. 10/07/16   Ward, Chase PicketJaime Pilcher, PA-C  predniSONE (DELTASONE) 10 MG tablet Take 2  tablets (20 mg total) by mouth daily. 09/29/16   Maxwell CaulLayden, Lindsey A, PA-C    Family History Family History  Problem Relation Age of Onset  . Hypertension Mother     Social History Social History  Substance Use Topics  . Smoking status: Current Every Day Smoker    Packs/day: 0.50  . Smokeless tobacco: Never Used  . Alcohol use Yes     Comment: occasional beer     Allergies   Peanut-containing drug products   Review of Systems Review of Systems  Constitutional: Negative for chills, diaphoresis and fever.  HENT: Negative for congestion and rhinorrhea.   Respiratory: Negative for cough, chest tightness, shortness of breath and wheezing.   Cardiovascular: Negative for chest pain.  Gastrointestinal: Negative for nausea and vomiting.  Musculoskeletal: Positive for myalgias. Negative for arthralgias and joint swelling.  Skin: Negative for color change and wound.  Allergic/Immunologic: Negative for immunocompromised state.  Neurological: Negative for weakness and numbness.  Hematological: Does not bruise/bleed easily.     Physical Exam Updated Vital Signs BP 129/81   Pulse 76   Temp 98.4 F (36.9 C)   Resp 18   SpO2 99%   Physical Exam  Constitutional: He appears well-developed and well-nourished. No distress.  HENT:  Head: Normocephalic and atraumatic.  Neck: Neck supple.  Cardiovascular: Normal rate, regular rhythm and normal heart sounds.   No murmur heard. Pulmonary/Chest: Effort normal and breath sounds normal. No respiratory distress. He has no wheezes.  He has no rales.  Lungs CTA bilaterally  Musculoskeletal: Normal range of motion.  TTP along R lateral rib cage. No overlying skin changes or crepitus.  Neurological: He is alert.  Skin: Skin is warm and dry.  Nursing note and vitals reviewed.    ED Treatments / Results  DIAGNOSTIC STUDIES: Oxygen Saturation is 99% on RA, NL by my interpretation.    COORDINATION OF CARE: 10:13 AM-Discussed next steps  with pt. Pt verbalized understanding and is agreeable with the plan. Will order medications. Pt prepared for d/c, advised of symptomatic care at home, F/U instructions and return precautions.    Labs (all labs ordered are listed, but only abnormal results are displayed) Labs Reviewed - No data to display  EKG  EKG Interpretation None       Radiology Dg Ribs Unilateral W/chest Right  Result Date: 10/07/2016 CLINICAL DATA:  Right ribcage pain for the past 4 days which began after playing basketball. Current smoker. History of bronchitis and asthma. EXAM: RIGHT RIBS AND CHEST - 3+ VIEW COMPARISON:  PA and lateral chest x-ray of September 29, 2016 FINDINGS: The lungs are adequately inflated and clear. The heart and pulmonary vascularity are normal. The mediastinum is normal in width. There is no pleural effusion or pneumothorax. Right rib detail images include a metallic BB over the lower ribcage. Deep to this no acute or chronic rib abnormality is observed. IMPRESSION: There is no active cardiopulmonary disease. No acute right rib abnormality is observed. Electronically Signed   By: David  Swazil[place]Jordan M.D.   On: 10/07/2016 10:12    Procedures Procedures (including critical care time)  Medications Ordered in ED Medications - No data to display   Initial Impression / Assessment and Plan / ED Course  I have reviewed the triage vital signs and the nursing notes.  Pertinent labs & imaging results that were available during my care of the patient were reviewed by me and considered in my medical decision making (see chart for details).    Carl Tucker is a 41 y.o. male who presents to ED for right rib pain after playing basketball a few days ago. Tenderness to the touch with no overlying skin changes. Lungs clear. X-ray negative. Will treat symptomatically. Follow up with PCP if symptoms do not improve. All questions answered.    Final Clinical Impressions(s) / ED Diagnoses   Final diagnoses:    Rib pain on right side    New Prescriptions Discharge Medication List as of 10/07/2016 10:40 AM    START taking these medications   Details  methocarbamol (ROBAXIN) 500 MG tablet Take 1 tablet (500 mg total) by mouth 2 (two) times daily as needed for muscle spasms., Starting Tue 10/07/2016, Print       I personally performed the services described in this documentation, which was scribed in my presence. The recorded information has been reviewed and is accurate.    Ward, Chase PicketJaime Pilcher, PA-C 10/07/16 1140    Alvira MondaySchlossman, Erin, MD 10/10/16 1131

## 2016-10-07 NOTE — ED Notes (Signed)
Bed: WTR6 Expected date:  Expected time:  Means of arrival:  Comments: 

## 2016-10-07 NOTE — ED Triage Notes (Signed)
Pt reports pain in r/rib x 4 days. C/o cramp and sharp pain after playing basketball 4 days ago. Tx with Aleve x1. Denies trama. Seen on 7/23 for cough. Pt stated that this has resolved

## 2017-05-17 ENCOUNTER — Encounter (HOSPITAL_COMMUNITY): Payer: Self-pay | Admitting: Emergency Medicine

## 2017-05-17 ENCOUNTER — Other Ambulatory Visit: Payer: Self-pay

## 2017-05-17 ENCOUNTER — Emergency Department (HOSPITAL_COMMUNITY): Payer: BLUE CROSS/BLUE SHIELD

## 2017-05-17 DIAGNOSIS — R062 Wheezing: Secondary | ICD-10-CM | POA: Insufficient documentation

## 2017-05-17 DIAGNOSIS — Z5321 Procedure and treatment not carried out due to patient leaving prior to being seen by health care provider: Secondary | ICD-10-CM | POA: Insufficient documentation

## 2017-05-17 MED ORDER — ALBUTEROL SULFATE (2.5 MG/3ML) 0.083% IN NEBU
5.0000 mg | INHALATION_SOLUTION | Freq: Once | RESPIRATORY_TRACT | Status: AC
  Administered 2017-05-17: 5 mg via RESPIRATORY_TRACT
  Filled 2017-05-17: qty 6

## 2017-05-17 NOTE — ED Triage Notes (Signed)
C/o audible wheezing since yesterday.  Using albuterol inhaler without relief.  Pt speaking in complete sentences.

## 2017-05-18 ENCOUNTER — Emergency Department (HOSPITAL_COMMUNITY)
Admission: EM | Admit: 2017-05-18 | Discharge: 2017-05-18 | Disposition: A | Discharge status: Home / Self Care | Payer: BLUE CROSS/BLUE SHIELD | Attending: Emergency Medicine | Admitting: Emergency Medicine

## 2017-05-18 NOTE — ED Notes (Signed)
Pt called for vitals reassessment, no response.  

## 2017-05-19 ENCOUNTER — Encounter (HOSPITAL_COMMUNITY): Payer: Self-pay

## 2017-05-19 ENCOUNTER — Emergency Department (HOSPITAL_COMMUNITY)
Admission: EM | Admit: 2017-05-19 | Discharge: 2017-05-19 | Disposition: A | Discharge status: Home / Self Care | Payer: BLUE CROSS/BLUE SHIELD | Attending: Emergency Medicine | Admitting: Emergency Medicine

## 2017-05-19 ENCOUNTER — Other Ambulatory Visit: Payer: Self-pay

## 2017-05-19 DIAGNOSIS — J4541 Moderate persistent asthma with (acute) exacerbation: Secondary | ICD-10-CM | POA: Diagnosis not present

## 2017-05-19 DIAGNOSIS — F17200 Nicotine dependence, unspecified, uncomplicated: Secondary | ICD-10-CM | POA: Diagnosis not present

## 2017-05-19 DIAGNOSIS — J40 Bronchitis, not specified as acute or chronic: Secondary | ICD-10-CM | POA: Diagnosis not present

## 2017-05-19 DIAGNOSIS — R05 Cough: Secondary | ICD-10-CM | POA: Diagnosis present

## 2017-05-19 MED ORDER — ALBUTEROL SULFATE (2.5 MG/3ML) 0.083% IN NEBU
5.0000 mg | INHALATION_SOLUTION | Freq: Once | RESPIRATORY_TRACT | Status: AC
  Administered 2017-05-19: 5 mg via RESPIRATORY_TRACT
  Filled 2017-05-19: qty 6

## 2017-05-19 MED ORDER — PREDNISONE 20 MG PO TABS
60.0000 mg | ORAL_TABLET | Freq: Once | ORAL | Status: AC
Start: 2017-05-19 — End: 2017-05-19
  Administered 2017-05-19: 60 mg via ORAL
  Filled 2017-05-19: qty 3

## 2017-05-19 MED ORDER — ALBUTEROL SULFATE HFA 108 (90 BASE) MCG/ACT IN AERS
4.0000 | INHALATION_SPRAY | Freq: Once | RESPIRATORY_TRACT | Status: AC
  Administered 2017-05-19: 4 via RESPIRATORY_TRACT
  Filled 2017-05-19: qty 6.7

## 2017-05-19 MED ORDER — IPRATROPIUM BROMIDE 0.02 % IN SOLN
0.5000 mg | Freq: Once | RESPIRATORY_TRACT | Status: AC
  Administered 2017-05-19: 0.5 mg via RESPIRATORY_TRACT
  Filled 2017-05-19: qty 2.5

## 2017-05-19 MED ORDER — PREDNISONE 10 MG PO TABS: 40.0000 mg | ORAL_TABLET | Freq: Every day | ORAL | 0 refills | Status: AC

## 2017-05-19 MED ORDER — ALBUTEROL (5 MG/ML) CONTINUOUS INHALATION SOLN
10.0000 mg/h | INHALATION_SOLUTION | RESPIRATORY_TRACT | Status: DC
  Administered 2017-05-19: 10 mg/h via RESPIRATORY_TRACT
  Filled 2017-05-19: qty 20

## 2017-05-19 NOTE — ED Triage Notes (Signed)
Patient c/o a productive cough with green sputum. Patient states [facility] for the same. Patient reports that he has been using albuterol neb treatments at home with no relief. Patient did not use neb today.

## 2017-05-19 NOTE — ED Provider Notes (Addendum)
[facility]-EMERGENCY DEPT Provider Note  CSN: [id number] Arrival date & time: 05/19/17 0759  Chief Complaint(s) Cough  HPI Carl Tucker Carl Tucker is Carl 42 y.o. male   The history is provided by the patient.  Cough  This is Carl new problem. Episode onset: 5 days. The problem occurs every few minutes. The problem has not changed since onset.The cough is productive of sputum. There has been no fever. Associated symptoms include rhinorrhea and shortness of breath. Pertinent negatives include no myalgias and no wheezing. He has tried cough syrup (albuterol) for the symptoms. The treatment provided mild relief. He is Carl smoker. His past medical history is significant for bronchitis and asthma.   Patient went to [facility] yesterday and had Carl breathing treatment.  Patient also had Carl chest x-ray which did not reveal any evidence of pneumonia, pulmonary edema, pneumothorax.  States that he could not stay to be seen and left prior to initial evaluation.   Past Medical History Past Medical History:  Diagnosis Date  . Asthma   . Bronchitis    Patient Active Problem List   Diagnosis Date Noted  . Bronchitis, acute 01/29/2011  . GERD (gastroesophageal reflux disease) 01/29/2011  . Tobacco abuse 01/29/2011  . Asthma exacerbation 01/28/2011    Class: Acute   Home Medication(s) Prior to Admission medications   Medication Sig Start Date End Date Taking? Authorizing Provider  albuterol (PROVENTIL HFA;VENTOLIN HFA) 108 (90 Base) MCG/ACT inhaler Inhale 2 puffs into the lungs every 4 (four) hours as needed for wheezing or shortness of breath. 09/29/16  Yes Graciella FreerLayden, Lindsey A, PA-C  guaiFENesin (ROBITUSSIN) 100 MG/5ML liquid Take 200 mg by mouth 3 (three) times daily as needed for cough.   Yes [provider]  Phenyleph-CPM-DM-APAP (ALKA-SELTZER PLUS COLD & FLU PO) Take 2 tablets by mouth daily as needed (COLD SYMPTOMS).   Yes [provider]  methocarbamol (ROBAXIN) 500 MG  tablet Take 1 tablet (500 mg total) by mouth 2 (two) times daily as needed for muscle spasms. Patient not taking: Reported on 05/19/2017 10/07/16   Ward, Chase PicketJaime Pilcher, PA-C  predniSONE (DELTASONE) 10 MG tablet Take 4 tablets (40 mg total) by mouth daily for 4 days. 05/19/17 05/23/17  Nira Connardama, Jaelie Aguilera Eduardo, MD                                                                                                                                    Past Surgical History History reviewed. No pertinent surgical history. Family History Family History  Problem Relation Age of Onset  . Hypertension Mother     Social History Social History   Tobacco Use  . Smoking status: Current Every Day Smoker    Packs/day: 0.50  . Smokeless tobacco: Never Used  Substance Use Topics  . Alcohol use: Yes    Comment: occasional beer  . Drug use: No    Comment: quit smoking 2 weeks  ago   Allergies Peanut-containing drug products  Review of Systems Review of Systems  HENT: Positive for rhinorrhea.   Respiratory: Positive for cough and shortness of breath. Negative for wheezing.   Musculoskeletal: Negative for myalgias.   All other systems are reviewed and are negative for acute change except as noted in the HPI  Physical Exam Vital Signs  I have reviewed the triage vital signs BP 122/82 (BP Location: Left Arm)   Pulse 94   Temp 98 F (36.7 C) (Oral)   Resp 18   Ht 5\' 9"  (1.753 m)   Wt 86.2 kg (190 lb)   SpO2 94%   BMI 28.06 kg/m   Physical Exam  Constitutional: He is oriented to person, place, and time. He appears well-developed and well-nourished. No distress.  HENT:  Head: Normocephalic and atraumatic.  Nose: Nose normal.  Postnasal drip  Eyes: Conjunctivae and EOM are normal. Pupils are equal, round, and reactive to light. Right eye exhibits no discharge. Left eye exhibits no discharge. No scleral icterus.  Neck: Normal range of motion. Neck supple.  Cardiovascular: Normal rate and regular  rhythm. Exam reveals no gallop and no friction rub.  No murmur heard. Pulmonary/Chest: Effort normal. No accessory muscle usage or stridor. No tachypnea. No respiratory distress. He has wheezes (insp and exp; diffuse). He has no rales.  Abdominal: Soft. He exhibits no distension. There is no tenderness.  Musculoskeletal: He exhibits no edema or tenderness.  Neurological: He is alert and oriented to person, place, and time.  Skin: Skin is warm and dry. No rash noted. He is not diaphoretic. No erythema.  Psychiatric: He has Carl normal mood and affect.  Vitals reviewed.   ED Results and Treatments Labs (all labs ordered are listed, but only abnormal results are displayed) Labs Reviewed - No data to display                                                                                                                       EKG  EKG Interpretation  Date/Time:    Ventricular Rate:    PR Interval:    QRS Duration:   QT Interval:    QTC Calculation:   R Axis:     Text Interpretation:        Radiology No results found. Pertinent labs & imaging results that were available during my care of the patient were reviewed by me and considered in my medical decision making (see chart for details).  Medications Ordered in ED Medications  albuterol (PROVENTIL,VENTOLIN) solution continuous neb (10 mg/hr Nebulization New Bag/Given 05/19/17 1113)  albuterol (PROVENTIL) (2.5 MG/3ML) 0.083% nebulizer solution 5 mg (5 mg Nebulization Given 05/19/17 0840)  ipratropium (ATROVENT) nebulizer solution 0.5 mg (0.5 mg Nebulization Given 05/19/17 1113)  predniSONE (DELTASONE) tablet 60 mg (60 mg Oral Given 05/19/17 1105)  albuterol (PROVENTIL HFA;VENTOLIN HFA) 108 (90 Base) MCG/ACT inhaler 4 puff (4 puffs Inhalation Given 05/19/17 1246)  Procedures Procedures CRITICAL CARE Performed  by: Amadeo Garnet Glenola Wheat Total critical care time: 30 minutes Critical care time was exclusive of separately billable procedures and treating other patients. Critical care was necessary to treat or prevent imminent or life-threatening deterioration. Critical care was time spent personally by me on the following activities: development of treatment plan with patient and/or surrogate as well as nursing, discussions with consultants, evaluation of patient's response to treatment, examination of patient, obtaining history from patient or surrogate, ordering and performing treatments and interventions, ordering and review of laboratory studies, ordering and review of radiographic studies, pulse oximetry and re-evaluation of patient's condition.   (including critical care time)  Medical Decision Making / ED Course I have reviewed the nursing notes for this encounter and the patient's prior records (if available in EHR or on provided paperwork).    Presentation is consistent with likely bronchitis with superimposed asthma exacerbation.  Patient provided with breathing treatment in triage with some improvement but still requires additional treatments.  Given additional continuous albuterol neb with Atrovent.  Given oral steroids.  Patient with improved lung sounds but still wheezy.  States that he feels significantly better and would like to be discharged home.  Counseled patient for approximately 5 minutes regarding smoking cessation. Discussed risks of smoking and how they applied and affected their visit here today. Patient is ready to quit at this time, however will follow up with their primary doctor. Will provide resources. CPT code: 81191: intermediate counseling for smoking cessation  The patient appears reasonably screened and/or stabilized for discharge and I doubt any other medical condition or other [facility] requiring further screening, evaluation, or treatment in the ED at this time prior to  discharge.  The patient is safe for discharge with strict return precautions.   Final Clinical Impression(s) / ED Diagnoses Final diagnoses:  Bronchitis  Moderate persistent asthma with exacerbation    Disposition: Discharge  Condition: Good  I have discussed the results, Dx and Tx plan with the patient who expressed understanding and agree(s) with the plan. Discharge instructions discussed at great length. The patient was given strict return precautions who verbalized understanding of the instructions. No further questions at time of discharge.    ED Discharge Orders        Ordered    predniSONE (DELTASONE) 10 MG tablet  Daily     05/19/17 1251       Follow Up: Primary care provider  Schedule an appointment as soon as possible for Carl visit  in 3-5 days     This chart was dictated using voice recognition software.  Despite best efforts to proofread,  errors can occur which can change the documentation meaning.     Nira Conn, MD 05/19/17 1719

## 2017-07-03 ENCOUNTER — Ambulatory Visit: Payer: BLUE CROSS/BLUE SHIELD | Admitting: Family Medicine

## 2017-07-03 ENCOUNTER — Encounter: Payer: Self-pay | Admitting: Family Medicine

## 2017-07-03 VITALS — BP 124/79 | HR 80 | Temp 98.5°F | Resp 16 | Ht 69.0 in | Wt 196.1 lb

## 2017-07-03 DIAGNOSIS — J4540 Moderate persistent asthma, uncomplicated: Secondary | ICD-10-CM | POA: Diagnosis not present

## 2017-07-03 MED ORDER — PREDNISONE 20 MG PO TABS: ORAL_TABLET | ORAL | 0 refills | Status: DC

## 2017-07-03 MED ORDER — ALBUTEROL SULFATE HFA 108 (90 BASE) MCG/ACT IN AERS: 2.0000 | INHALATION_SPRAY | Freq: Four times a day (QID) | RESPIRATORY_TRACT | 0 refills | Status: DC | PRN

## 2017-07-03 MED ORDER — FLUTICASONE-SALMETEROL 250-50 MCG/DOSE IN AEPB: INHALATION_SPRAY | RESPIRATORY_TRACT | 6 refills | Status: DC

## 2017-07-03 NOTE — Progress Notes (Signed)
Office Note 07/03/2017  CC:  Chief Complaint  Patient presents with  . Establish Care  . Asthma    wants to get set up with a nebulizer    HPI:  Carl Tucker is a 42 y.o. male who is here to establish care. Patient's most recent primary MD: Dr. Valentina Lucks w/[facility]--few years ago. Old records were not reviewed prior to or during today's visit.  In the last 2 weeks he has required an average of 3-4 puffs of ProAir daily.  Wheezing and SOB are his sx's. Proair makes sx's much better.   He does smoke cigarettes.   He had a case of acute asthmatic bronchitis 05/2017, prednisone rx'd and helped. Prior to this, he would have about 1 asthma flare per year that required prednisone.  He has had no recent URI sx's.     Past Medical History:  Diagnosis Date  . Moderate persistent asthma    Since infancy.  Was on advair at one time.  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  +URIs  . Seasonal allergies    much less problematic as an adult.  . Tobacco dependence     History reviewed. No pertinent surgical history.  Family History  Problem Relation Age of Onset  . Hypertension Mother   . Diabetes Maternal Grandmother   . Hypertension Maternal Grandmother   . Stroke Maternal Grandmother   . Asthma Maternal Grandfather   . Hypertension Maternal Grandfather   . Kidney disease Paternal Grandfather     Social History   Socioeconomic History  . Marital status: Single    Spouse name: Not on file  . Number of children: Not on file  . Years of education: Not on file  . Highest education level: Not on file  Occupational History  . Not on file  Social Needs  . Financial resource strain: Not on file  . Food insecurity:    Worry: Not on file    Inability: Not on file  . Transportation needs:    Medical: Not on file    Non-medical: Not on file  Tobacco Use  . Smoking status: Current Every Day Smoker    Packs/day: 0.50    Years: 12.00    Pack years: 6.00    Types:  Cigarettes  . Smokeless tobacco: Never Used  Substance and Sexual Activity  . Alcohol use: Yes    Alcohol/week: 4.8 oz    Types: 8 Cans of beer per week  . Drug use: No    Comment: quit smoking 2 weeks ago  . Sexual activity: Not on file  Lifestyle  . Physical activity:    Days per week: Not on file    Minutes per session: Not on file  . Stress: Not on file  Relationships  . Social connections:    Talks on phone: Not on file    Gets together: Not on file    Attends religious service: Not on file    Active member of club or organization: Not on file    Attends meetings of clubs or organizations: Not on file    Relationship status: Not on file  . Intimate partner violence:    Fear of current or ex partner: Not on file    Emotionally abused: Not on file    Physically abused: Not on file    Forced sexual activity: Not on file  Other Topics Concern  . Not on file  Social History Narrative   Single, no children.  3 sisters.   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   Tob: 10 pack-yr hx--current as of 06/2017.   Alc: 8 beers per week.    Outpatient Encounter Medications as of 07/03/2017  Medication Sig  . [DISCONTINUED] albuterol (PROVENTIL HFA;VENTOLIN HFA) 108 (90 Base) MCG/ACT inhaler Inhale 2 puffs into the lungs every 4 (four) hours as needed for wheezing or shortness of breath.  Marland Kitchen. albuterol (VENTOLIN HFA) 108 (90 Base) MCG/ACT inhaler Inhale 2 puffs into the lungs every 6 (six) hours as needed for wheezing or shortness of breath.  . Fluticasone-Salmeterol (ADVAIR) 250-50 MCG/DOSE AEPB 1 puff bid  . predniSONE (DELTASONE) 20 MG tablet 2 tabs po qd x 5d  . [DISCONTINUED] guaiFENesin (ROBITUSSIN) 100 MG/5ML liquid Take 200 mg by mouth 3 (three) times daily as needed for cough.  . [DISCONTINUED] methocarbamol (ROBAXIN) 500 MG tablet Take 1 tablet (500 mg total) by mouth 2 (two) times daily as needed for muscle spasms. (Patient not taking:  Reported on 05/19/2017)  . [DISCONTINUED] Phenyleph-CPM-DM-APAP (ALKA-SELTZER PLUS COLD & FLU PO) Take 2 tablets by mouth daily as needed (COLD SYMPTOMS).   No facility-administered encounter medications on file as of 07/03/2017.     Allergies  Allergen Reactions  . Peanut-Containing Drug Products Swelling    Swelling of the tongue    ROS Review of Systems  Constitutional: Negative for fatigue and fever.  HENT: Negative for congestion and sore throat.   Eyes: Negative for visual disturbance.  Respiratory: Negative for cough.   Cardiovascular: Negative for chest pain.  Gastrointestinal: Negative for abdominal pain and nausea.  Genitourinary: Negative for dysuria.  Musculoskeletal: Negative for back pain and joint swelling.  Skin: Negative for rash.  Neurological: Negative for weakness and headaches.  Hematological: Negative for adenopathy.    PE; Blood pressure 124/79, pulse 80, temperature 98.5 F (36.9 C), temperature source Oral, resp. rate 16, height 5\' 9"  (1.753 m), weight 196 lb 2 oz (89 kg), SpO2 97 %. Body mass index is 28.96 kg/m.  Gen: Alert, well appearing.  Patient is oriented to person, place, time, and situation. AFFECT: pleasant, lucid thought and speech. AVW:UJWJENT:Eyes: no injection, icteris, swelling, or exudate.  EOMI, PERRLA. Mouth: lips without lesion/swelling.  Oral mucosa pink and moist. Oropharynx without erythema, exudate, or swelling.  Neck - No masses or thyromegaly or limitation in range of motion CV: RRR, no m/r/g.   LUNGS: diffuse soft insp/exp wheezing, aeration is pretty good, exp phase not excessively prolonged, nonlabored resps. ABD: soft, NT/ND EXT: no clubbing, cyanosis, or edema.   Pertinent labs:  none  ASSESSMENT AND PLAN:   New pt;  1) Moderate persistent asthma: not well controlled. Prednisone 40 mg qd x 5d.  Start advair 250/50 1 puff bid now. Albuterol HFA (aerochamber rx given): 2 q q6h prn. Goal is: only requires albut 2 days per  week in daytime and 2 nights per month (or less).  An After Visit Summary was printed and given to the patient.  Return in about 6 weeks (around 08/14/2017) for f/u asthma.  Signed:  Santiago BumpersPhil Birt Reinoso, MD           07/03/2017

## 2017-08-14 ENCOUNTER — Ambulatory Visit: Payer: 59 | Admitting: Family Medicine

## 2017-08-28 ENCOUNTER — Ambulatory Visit: Payer: 59 | Admitting: Family Medicine

## 2017-09-25 ENCOUNTER — Ambulatory Visit: Payer: 59 | Admitting: Family Medicine

## 2017-09-25 ENCOUNTER — Encounter: Payer: Self-pay | Admitting: Family Medicine

## 2017-09-25 VITALS — BP 119/78 | HR 85 | Temp 98.8°F | Resp 16 | Ht 69.0 in | Wt 191.4 lb

## 2017-09-25 DIAGNOSIS — J4540 Moderate persistent asthma, uncomplicated: Secondary | ICD-10-CM | POA: Diagnosis not present

## 2017-09-25 MED ORDER — ALBUTEROL SULFATE HFA 108 (90 BASE) MCG/ACT IN AERS: 2.0000 | INHALATION_SPRAY | Freq: Four times a day (QID) | RESPIRATORY_TRACT | 0 refills | Status: DC | PRN

## 2017-09-25 NOTE — Progress Notes (Signed)
OFFICE VISIT  09/25/2017   CC:  Chief Complaint  Patient presents with  . Follow-up    asthma     HPI:    Patient is a [age] y.o.  male who presents for 3 mo f/u moderate persistent asthma and tobacco dependence. Last visit I gave him a prednisone burst and I started him on advair 250/50, 1p bid. Doing great.  No albut use since starting advair.  No adverse effects from advair.  Still smoking but has cut back. He wants to continue this approach.   He says that the cig smoking does not trigger any wheezing or chest tightness.    Past Medical History:  Diagnosis Date  . Moderate persistent asthma    Since infancy.  Was on advair at one time.  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  +URIs  . Seasonal allergies    much less problematic as an adult.  . Tobacco dependence     History reviewed. No pertinent surgical history.  Outpatient Medications Prior to Visit  Medication Sig Dispense Refill  . Fluticasone-Salmeterol (ADVAIR) 250-50 MCG/DOSE AEPB 1 puff bid 60 each 6  . albuterol (VENTOLIN HFA) 108 (90 Base) MCG/ACT inhaler Inhale 2 puffs into the lungs every 6 (six) hours as needed for wheezing or shortness of breath. 1 Inhaler 0  . predniSONE (DELTASONE) 20 MG tablet 2 tabs po qd x 5d (Patient not taking: Reported on 09/25/2017) 10 tablet 0   No facility-administered medications prior to visit.     Allergies  Allergen Reactions  . Peanut-Containing Drug Products Swelling    Swelling of the tongue    ROS As per HPI  PE: Blood pressure 119/78, pulse 85, temperature 98.8 F (37.1 C), temperature source Oral, resp. rate 16, height 5\' 9"  (1.753 m), weight 191 lb 6 oz (86.8 kg), SpO2 97 %. Body mass index is 28.26 kg/m.  Gen: Alert, well appearing.  Patient is oriented to person, place, time, and situation. AFFECT: pleasant, lucid thought and speech. RUE:AVWUENT:Eyes: no injection, icteris, swelling, or exudate.  EOMI, PERRLA. Mouth: lips without lesion/swelling.   Oral mucosa pink and moist. Oropharynx without erythema, exudate, or swelling.  CV: RRR, no m/r/g.   LUNGS: CTA bilat, nonlabored resps, good aeration in all lung fields. EXT: no clubbing, cyanosis, or edema.    LABS:    Chemistry      Component Value Date/Time   NA 139 01/28/2011 0904   K 4.1 01/28/2011 0904   CL 100 01/28/2011 0904   CO2 30 01/28/2011 0904   BUN 13 01/28/2011 0904   CREATININE 1.14 01/28/2011 0904      Component Value Date/Time   CALCIUM 9.3 01/28/2011 0904   ALKPHOS 85 01/28/2011 0904   AST 48 (H) 01/28/2011 0904   ALT 63 (H) 01/28/2011 0904   BILITOT 0.2 (L) 01/28/2011 0904       IMPRESSION AND PLAN:  Moderate persistent asthma: great control the last 3 mo since getting on advair 250/50 1 p bid. Continue this dosing for 6 more months and if still very well controlled then will decrease advair discus dose to 100/50, 1 p bid. Encouraged complete smoking cessation today.  He declined med assistance or nicotine replacement recommendations today. He wants to continue to slowly cut back.  An After Visit Summary was printed and given to the patient.  FOLLOW UP: Return in about 6 months (around 03/28/2018) for annual CPE (fasting).  Signed:  Santiago BumpersPhil McGowen, MD  09/25/2017     

## 2018-01-04 ENCOUNTER — Other Ambulatory Visit: Payer: Self-pay | Admitting: Family Medicine

## 2018-04-02 ENCOUNTER — Encounter: Payer: Self-pay | Admitting: Family Medicine

## 2018-04-02 ENCOUNTER — Encounter (INDEPENDENT_AMBULATORY_CARE_PROVIDER_SITE_OTHER): Payer: Self-pay

## 2018-04-02 ENCOUNTER — Ambulatory Visit: Payer: 59 | Admitting: Family Medicine

## 2018-04-02 VITALS — BP 124/74 | HR 79 | Temp 98.5°F | Resp 16 | Ht 69.5 in | Wt 194.1 lb

## 2018-04-02 DIAGNOSIS — Z0000 Encounter for general adult medical examination without abnormal findings: Secondary | ICD-10-CM

## 2018-04-02 DIAGNOSIS — E663 Overweight: Secondary | ICD-10-CM

## 2018-04-02 DIAGNOSIS — J4540 Moderate persistent asthma, uncomplicated: Secondary | ICD-10-CM | POA: Diagnosis not present

## 2018-04-02 NOTE — Progress Notes (Signed)
Office Note 04/02/2018  CC:  Chief Complaint  Patient presents with  . Annual Exam    Pt is fasting.    HPI:  Carl Tucker is a 43 y.o. Black male who is here for annual health maintenance exam.  Exercise: walks a lot and is physically active.  No formal exercise regimen. Diet: fairly healthy.    Asthma has been well controlled, no albuterol requirement lately. Ran out of advair last week. Still smoking.   Past Medical History:  Diagnosis Date  . Moderate persistent asthma    Since infancy.  Was on advair at one time.  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  +URIs  . Seasonal allergies    much less problematic as an adult.  . Tobacco dependence     History reviewed. No pertinent surgical history.  Family History  Problem Relation Age of Onset  . Hypertension Mother   . Diabetes Maternal Grandmother   . Hypertension Maternal Grandmother   . Stroke Maternal Grandmother   . Asthma Maternal Grandfather   . Hypertension Maternal Grandfather   . Kidney disease Paternal Grandfather     Social History   Socioeconomic History  . Marital status: Single    Spouse name: Not on file  . Number of children: Not on file  . Years of education: Not on file  . Highest education level: Not on file  Occupational History  . Not on file  Social Needs  . Financial resource strain: Not on file  . Food insecurity:    Worry: Not on file    Inability: Not on file  . Transportation needs:    Medical: Not on file    Non-medical: Not on file  Tobacco Use  . Smoking status: Current Every Day Smoker    Packs/day: 0.50    Years: 12.00    Pack years: 6.00    Types: Cigarettes  . Smokeless tobacco: Never Used  Substance and Sexual Activity  . Alcohol use: Yes    Alcohol/week: 8.0 standard drinks    Types: 8 Cans of beer per week  . Drug use: No    Comment: quit smoking 2 weeks ago  . Sexual activity: Not on file  Lifestyle  . Physical activity:    Days per  week: Not on file    Minutes per session: Not on file  . Stress: Not on file  Relationships  . Social connections:    Talks on phone: Not on file    Gets together: Not on file    Attends religious service: Not on file    Active member of club or organization: Not on file    Attends meetings of clubs or organizations: Not on file    Relationship status: Not on file  . Intimate partner violence:    Fear of current or ex partner: Not on file    Emotionally abused: Not on file    Physically abused: Not on file    Forced sexual activity: Not on file  Other Topics Concern  . Not on file  Social History Narrative   Single, no children.  3 sisters.   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   Tob: 10 pack-yr hx--current as of 06/2017.   Alc: 8 beers per week.    Outpatient Medications Prior to Visit  Medication Sig Dispense Refill  . Fluticasone-Salmeterol (ADVAIR) 250-50 MCG/DOSE AEPB 1 puff bid 60 each 6  . VENTOLIN HFA 108 (90  Base) MCG/ACT inhaler INHALE 2 PUFFS BY MOUTH EVERY 6 HOURS AS NEEDED FOR WHEEZING OR SHORTNESS OF BREATH 18 each 1   No facility-administered medications prior to visit.     Allergies  Allergen Reactions  . Peanut-Containing Drug Products Swelling    Swelling of the tongue    ROS Review of Systems  Constitutional: Negative for appetite change, chills, fatigue and fever.  HENT: Negative for congestion, dental problem, ear pain and sore throat.   Eyes: Negative for discharge, redness and visual disturbance.  Respiratory: Negative for cough, chest tightness, shortness of breath and wheezing.   Cardiovascular: Negative for chest pain, palpitations and leg swelling.  Gastrointestinal: Negative for abdominal pain, blood in stool, diarrhea, nausea and vomiting.  Genitourinary: Negative for difficulty urinating, dysuria, flank pain, frequency, hematuria and urgency.  Musculoskeletal: Negative for arthralgias, back pain, joint  swelling, myalgias and neck stiffness.  Skin: Negative for pallor and rash.  Neurological: Negative for dizziness, speech difficulty, weakness and headaches.  Hematological: Negative for adenopathy. Does not bruise/bleed easily.  Psychiatric/Behavioral: Negative for confusion and sleep disturbance. The patient is not nervous/anxious.     PE; Blood pressure 124/74, pulse 79, temperature 98.5 F (36.9 C), temperature source Oral, resp. rate 16, height 5' 9.5" (1.765 m), weight 194 lb 2 oz (88.1 kg), SpO2 98 %. Body mass index is 28.26 kg/m.  Gen: Alert, well appearing.  Patient is oriented to person, place, time, and situation. AFFECT: pleasant, lucid thought and speech. ENT: Ears: EACs clear, normal epithelium.  TMs with good light reflex and landmarks bilaterally.  Eyes: no injection, icteris, swelling, or exudate.  EOMI, PERRLA. Nose: no drainage or turbinate edema/swelling.  No injection or focal lesion.  Mouth: lips without lesion/swelling.  Oral mucosa pink and moist.  Dentition intact and without obvious caries or gingival swelling.  Oropharynx without erythema, exudate, or swelling.  Neck: supple/nontender.  No LAD, mass, or TM.  Carotid pulses 2+ bilaterally, without bruits. CV: RRR, no m/r/g.   LUNGS: CTA bilat, nonlabored resps, good aeration in all lung fields. ABD: soft, NT, ND, BS normal.  No hepatospenomegaly or mass.  No bruits. EXT: no clubbing, cyanosis, or edema.  Musculoskeletal: no joint swelling, erythema, warmth, or tenderness.  ROM of all joints intact. Skin - no sores or suspicious lesions or rashes or color changes   Pertinent labs:  No results found for: TSH Lab Results  Component Value Date   WBC 5.7 01/28/2011   HGB 14.0 01/28/2011   HCT 39.7 01/28/2011   MCV 72.8 (L) 01/28/2011   PLT 220 01/28/2011   Lab Results  Component Value Date   CREATININE 1.14 01/28/2011   BUN 13 01/28/2011   NA 139 01/28/2011   K 4.1 01/28/2011   CL 100 01/28/2011   CO2  30 01/28/2011   Lab Results  Component Value Date   ALT 63 (H) 01/28/2011   AST 48 (H) 01/28/2011   ALKPHOS 85 01/28/2011   BILITOT 0.2 (L) 01/28/2011   No results found for: CHOL No results found for: HDL No results found for: LDLCALC No results found for: TRIG No results found for: CHOLHDL No results found for: PSA  No results found for: HGBA1C   ASSESSMENT AND PLAN:   Health maintenance exam: Reviewed age and gender appropriate health maintenance issues (prudent diet, regular exercise, health risks of tobacco and excessive alcohol, use of seatbelts, fire alarms in home, use of sunscreen).  Also reviewed age and gender appropriate health screening as  well as vaccine recommendations. Vaccines: flu vaccine-->pt declined.   Pneumovax 23-->pt declined. Labs: fasting HP labs today. Prostate ca screening: average risk patient= as per latest guidelines, start screening at 45-50 yrs of age. Colon ca screening: average risk patient= as per latest guidelines, start screening at 41 yrs of age.  An After Visit Summary was printed and given to the patient.  FOLLOW UP:  Return in about 6 months (around 10/01/2018) for routine chronic illness f/u.  Signed:  Santiago Bumpers, MD           04/02/2018

## 2018-04-02 NOTE — Patient Instructions (Signed)

## 2018-04-03 LAB — COMPREHENSIVE METABOLIC PANEL
AG Ratio: 1.5 (calc) (ref 1.0–2.5)
ALBUMIN MSPROF: 4.5 g/dL (ref 3.6–5.1)
ALT: 36 U/L (ref 9–46)
AST: 21 U/L (ref 10–40)
Alkaline phosphatase (APISO): 64 U/L (ref 40–115)
BUN: 12 mg/dL (ref 7–25)
CO2: 25 mmol/L (ref 20–32)
CREATININE: 1.07 mg/dL (ref 0.60–1.35)
Calcium: 9.9 mg/dL (ref 8.6–10.3)
Chloride: 103 mmol/L (ref 98–110)
GLUCOSE: 92 mg/dL (ref 65–99)
Globulin: 3 g/dL (calc) (ref 1.9–3.7)
Potassium: 4.2 mmol/L (ref 3.5–5.3)
SODIUM: 139 mmol/L (ref 135–146)
TOTAL PROTEIN: 7.5 g/dL (ref 6.1–8.1)
Total Bilirubin: 0.8 mg/dL (ref 0.2–1.2)

## 2018-04-03 LAB — CBC WITH DIFFERENTIAL/PLATELET
ABSOLUTE MONOCYTES: 442 {cells}/uL (ref 200–950)
BASOS ABS: 53 {cells}/uL (ref 0–200)
BASOS PCT: 0.8 %
Eosinophils Absolute: 119 cells/uL (ref 15–500)
Eosinophils Relative: 1.8 %
HCT: 38.6 % (ref 38.5–50.0)
Hemoglobin: 13.4 g/dL (ref 13.2–17.1)
Lymphs Abs: 2462 cells/uL (ref 850–3900)
MCH: 26 pg — AB (ref 27.0–33.0)
MCHC: 34.7 g/dL (ref 32.0–36.0)
MCV: 75 fL — AB (ref 80.0–100.0)
MPV: 11.1 fL (ref 7.5–12.5)
Monocytes Relative: 6.7 %
NEUTROS PCT: 53.4 %
Neutro Abs: 3524 cells/uL (ref 1500–7800)
PLATELETS: 269 10*3/uL (ref 140–400)
RBC: 5.15 10*6/uL (ref 4.20–5.80)
RDW: 14 % (ref 11.0–15.0)
TOTAL LYMPHOCYTE: 37.3 %
WBC: 6.6 10*3/uL (ref 3.8–10.8)

## 2018-04-03 LAB — LIPID PANEL
Cholesterol: 207 mg/dL — ABNORMAL HIGH (ref <200)
HDL: 51 mg/dL (ref >40)
LDL Cholesterol (Calc): 138 mg/dL (calc) — ABNORMAL HIGH
NON-HDL CHOLESTEROL (CALC): 156 mg/dL — AB (ref <130)
Total CHOL/HDL Ratio: 4.1 (calc) (ref <5.0)
Triglycerides: 84 mg/dL (ref <150)

## 2018-04-03 LAB — TSH: TSH: 1.4 m[IU]/L (ref 0.40–4.50)

## 2018-04-28 ENCOUNTER — Encounter: Payer: Self-pay | Admitting: Family Medicine

## 2018-06-25 ENCOUNTER — Other Ambulatory Visit: Payer: Self-pay | Admitting: Family Medicine

## 2018-07-02 ENCOUNTER — Telehealth: Payer: 59 | Admitting: Physician Assistant

## 2018-07-02 ENCOUNTER — Encounter: Payer: Self-pay | Admitting: Family Medicine

## 2018-07-02 DIAGNOSIS — R319 Hematuria, unspecified: Secondary | ICD-10-CM

## 2018-07-02 NOTE — Progress Notes (Signed)
Based on what you shared with me, I feel your condition warrants further evaluation and I recommend that you be seen for a face to face office visit. You should contact your primary care provider or seek care at a local urgent care.     NOTE: If you entered your credit card information for this eVisit, you will not be charged. You may see a "hold" on your card for the $35 but that hold will drop off and you will not have a charge processed.  If you are having a true medical emergency please call 911.  If you need an urgent face to face visit, [facility] has four urgent care centers for your convenience.     The following sites will take your insurance:  . [facility]  [phone] Get Driving Directions Find a Provider at this Location  [address] [place], [place] [address] . 10 am to 8 pm Monday-Friday . 12 pm to 8 pm Saturday-Sunday   . [facility]  [phone] Get Driving Directions Find a Provider at this Location  [address] [place], [place] [address] . 8 am to 8 pm Monday-Friday . 9 am to 6 pm Saturday . 11 am to 6 pm Sunday   . [facility] at [facility]  [phone] Get Driving Directions  [address] [place], [place] [address] . 8 am to 8 pm Monday-Friday . 8 am to 4 pm Saturday-Sunday   Your e-visit answers were reviewed by a board certified advanced clinical practitioner to complete your personal care plan.  Thank you for using e-Visits.  I have spent 5 minutes in review of e-visit questionnaire, review and updating patient chart, medical decision making and response to patient.    Benjiman Core, PA-C

## 2018-08-27 ENCOUNTER — Other Ambulatory Visit: Payer: Self-pay | Admitting: Family Medicine

## 2018-08-30 ENCOUNTER — Other Ambulatory Visit: Payer: Self-pay | Admitting: Family Medicine

## 2018-08-30 NOTE — Telephone Encounter (Signed)
Copied from [place] [id number]. Topic: Quick Communication - Rx Refill/Question >> Aug 30, 2018 11:05 AM Lionel December wrote: Medication:   ADVAIR DISKUS 250-50 MCG/DOSE AEPB  Has the patient contacted their pharmacy? Yes.   (Agent: If no, request that the patient contact the pharmacy for the refill.) (Agent: If yes, when and what did the pharmacy advise?)  Preferred Pharmacy (with phone number or street name):  [place], [place] - [address] [phone] (Phone) [phone] (Fax)     Agent: Please be advised that RX refills may take up to 3 business days. We ask that you follow-up with your pharmacy.

## 2018-08-30 NOTE — Telephone Encounter (Signed)
Duplicate request.  RX sent 08/27/2018.

## 2018-10-01 ENCOUNTER — Ambulatory Visit: Payer: 59 | Admitting: Family Medicine

## 2019-02-08 ENCOUNTER — Encounter: Payer: Self-pay | Admitting: Family Medicine

## 2019-02-09 ENCOUNTER — Other Ambulatory Visit: Payer: Self-pay

## 2019-02-09 ENCOUNTER — Other Ambulatory Visit: Payer: Self-pay | Admitting: Family Medicine

## 2019-02-09 MED ORDER — ADVAIR DISKUS 250-50 MCG/DOSE IN AEPB: INHALATION_SPRAY | RESPIRATORY_TRACT | 0 refills | Status: DC

## 2019-02-12 ENCOUNTER — Encounter: Payer: Self-pay | Admitting: Family Medicine

## 2019-03-15 ENCOUNTER — Ambulatory Visit: Payer: Self-pay | Attending: Internal Medicine

## 2019-03-15 DIAGNOSIS — Z20822 Contact with and (suspected) exposure to covid-19: Secondary | ICD-10-CM | POA: Insufficient documentation

## 2019-03-17 LAB — NOVEL CORONAVIRUS, NAA: SARS-CoV-2, NAA: NOT DETECTED

## 2019-04-07 IMAGING — CR DG CHEST 2V
2 series · 2 of 2 positions shown · non-contrast
Comparison: 01/28/2011, 01/08/2005.

CLINICAL DATA: Current history of asthma presenting with a 2 week
history of cough, chest congestion and postnasal drip.

EXAM:
CHEST  2 VIEW

[w chest pa]
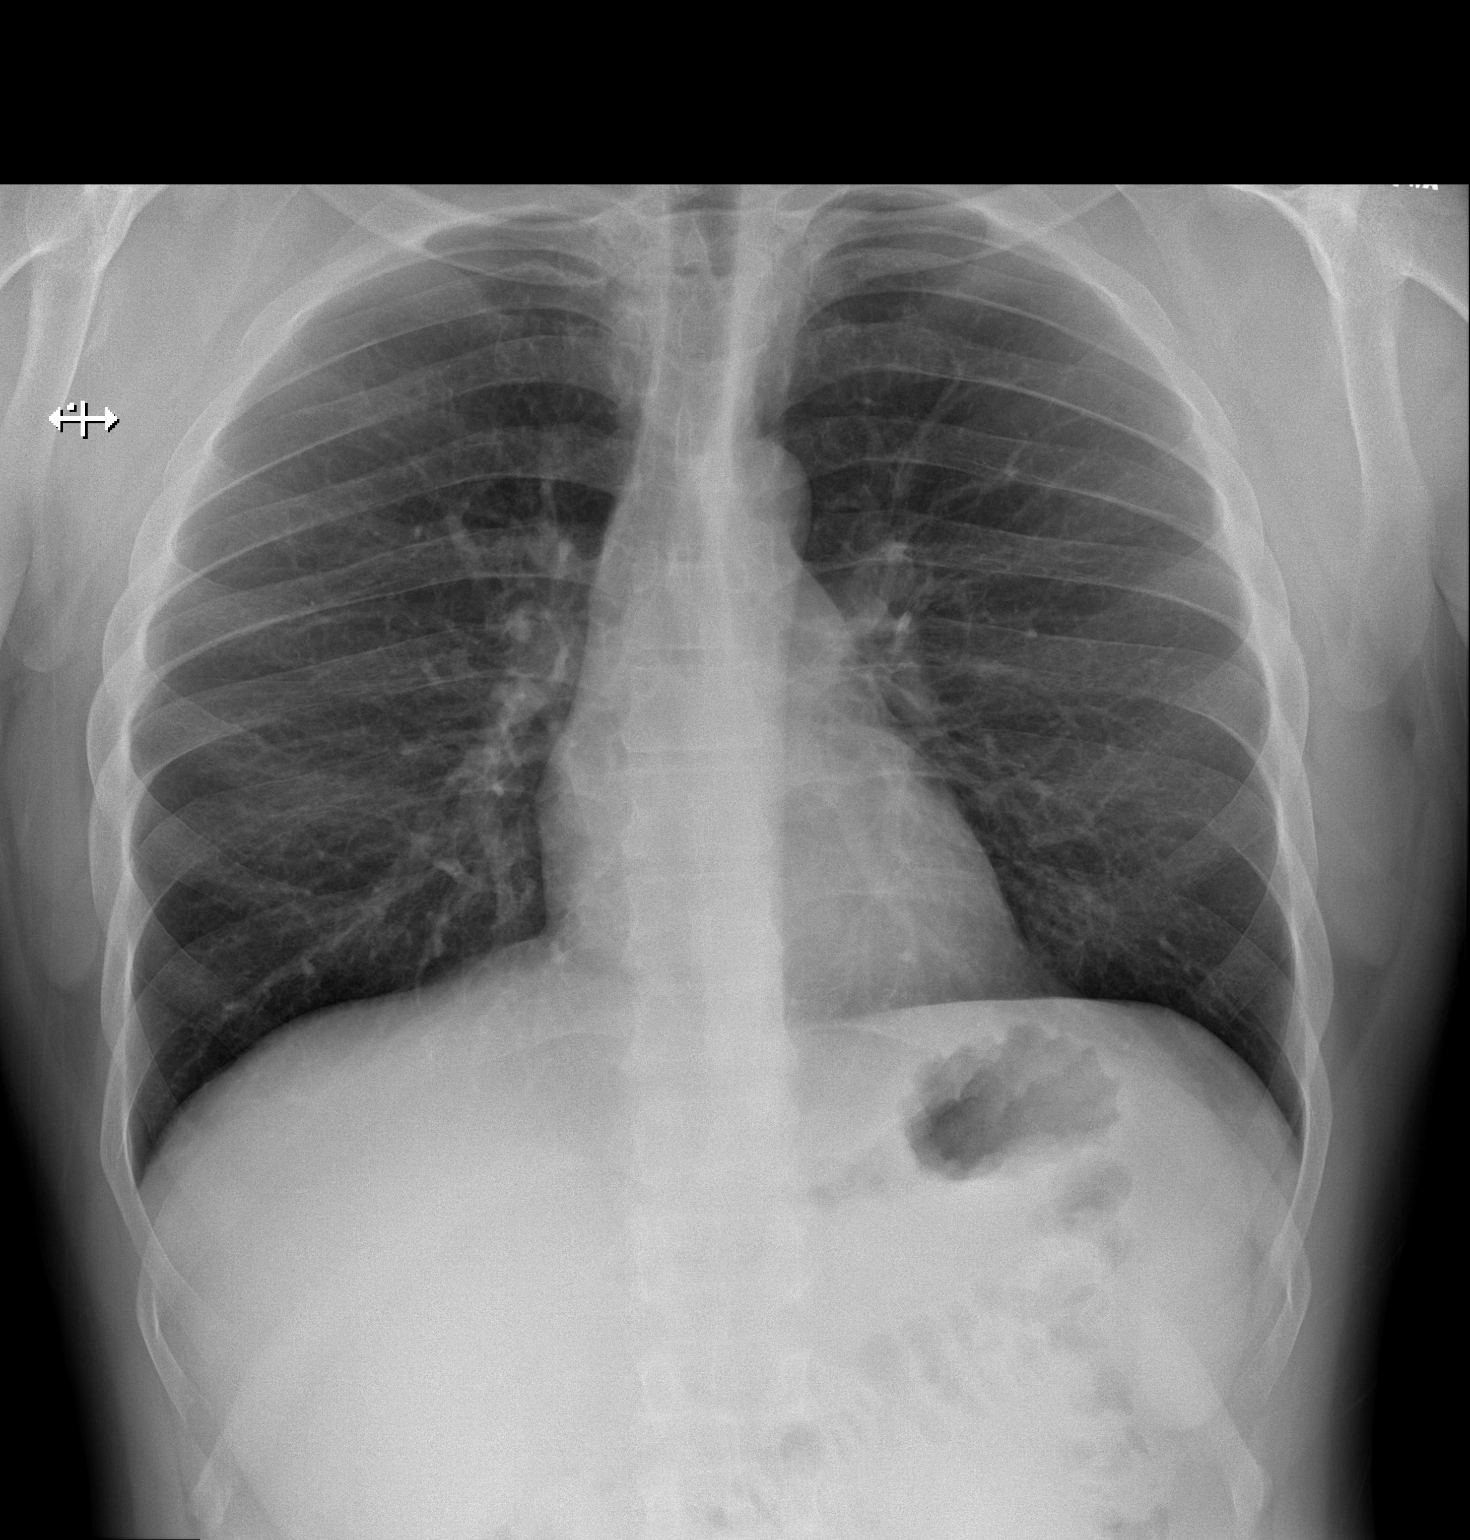

[w chest lat]
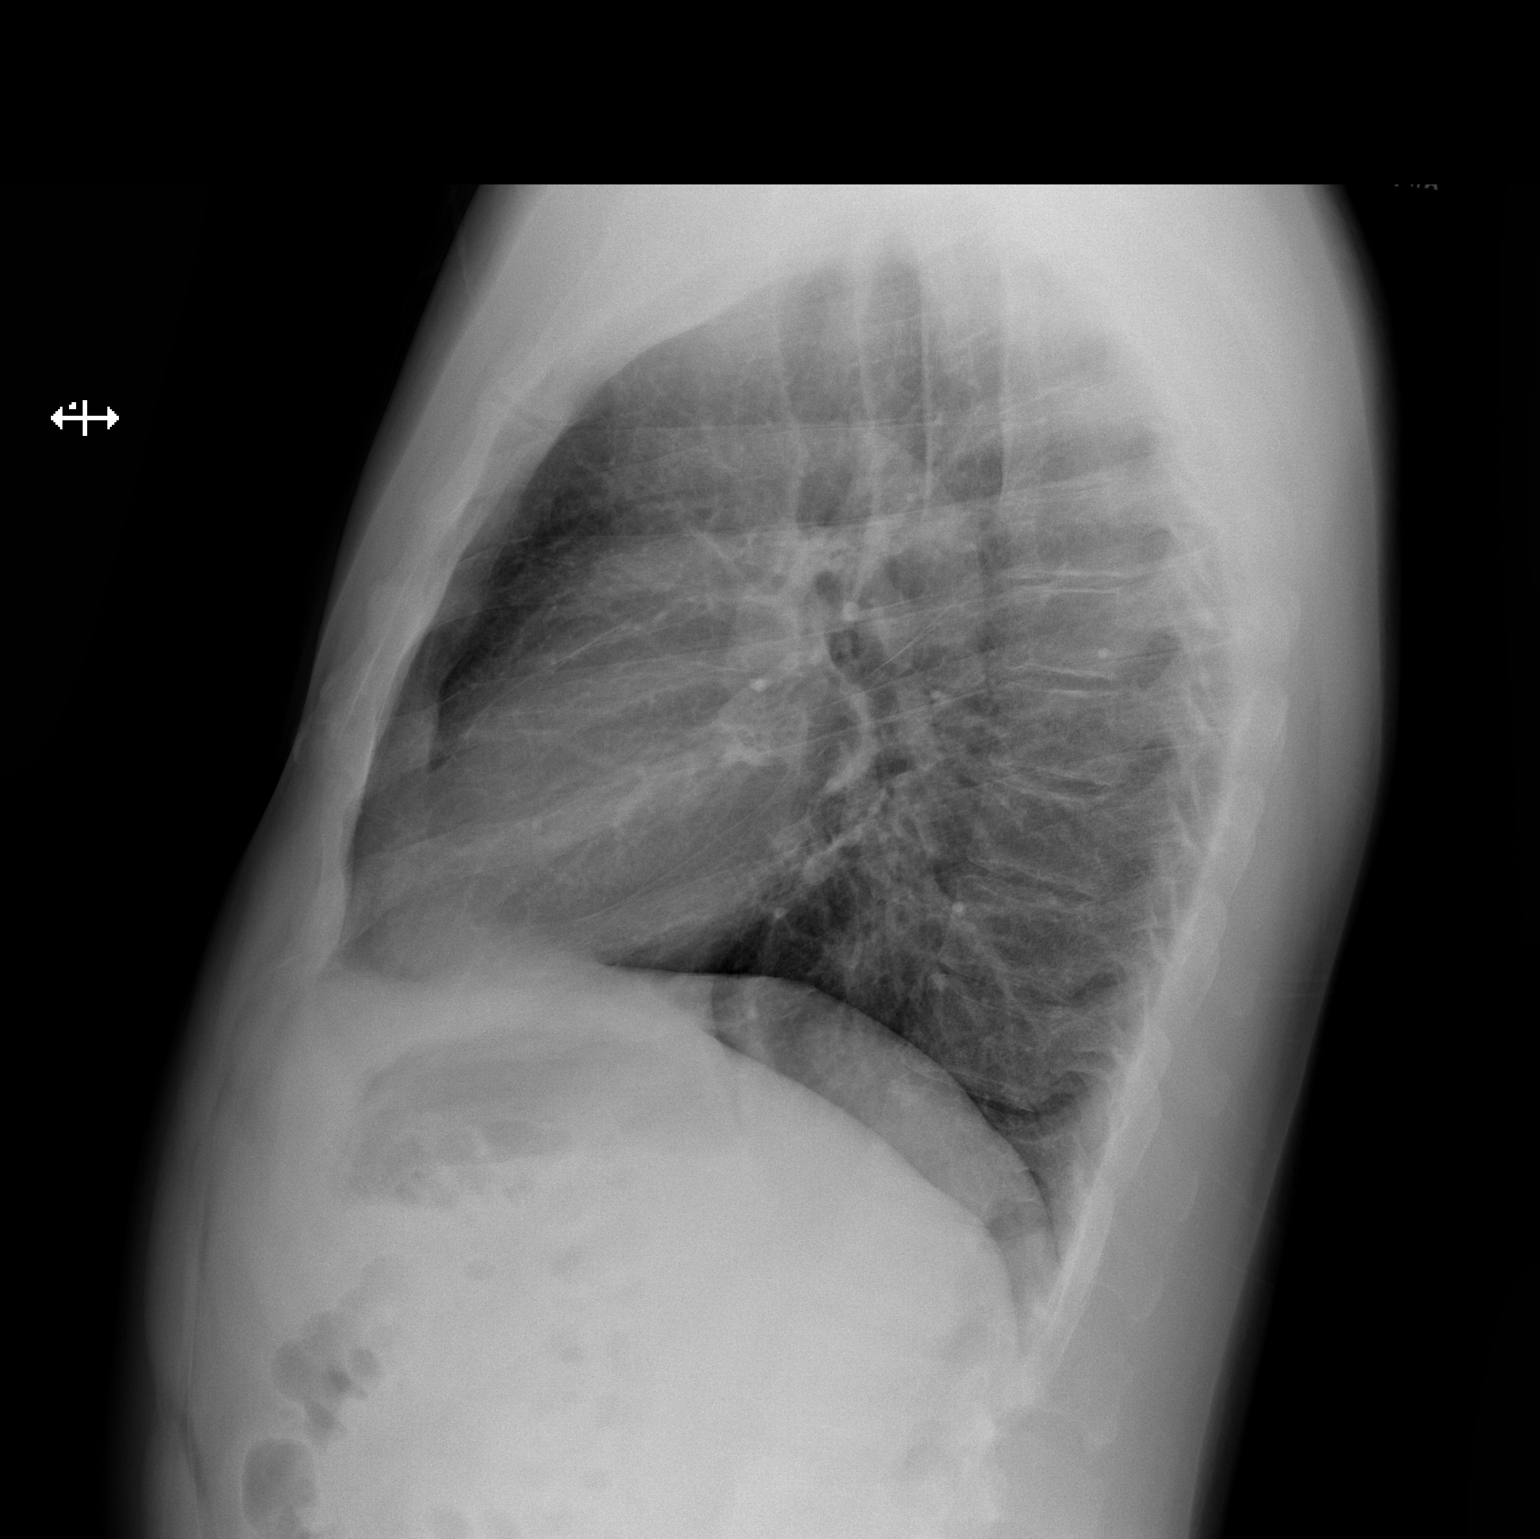

[2 of 2 positions shown; findings below may reference images not displayed]

FINDINGS: Cardiomediastinal silhouette unremarkable, unchanged. Lungs clear.
Bronchovascular markings normal. Pulmonary vascularity normal. No
visible pleural effusions. No pneumothorax. Visualized bony thorax
intact.
IMPRESSION: Normal examination.
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
## 2019-05-26 DIAGNOSIS — R519 Headache, unspecified: Secondary | ICD-10-CM
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
## 2019-05-26 NOTE — ED Notes (Signed)
Bed: 08  Expected date:   Expected time:   Means of arrival: [facility]  Comments:  EMS: Lianne Cure, RN  05/26/19 2206

## 2019-05-27 ENCOUNTER — Inpatient Hospital Stay
Admit: 2019-05-27 | Discharge: 2019-05-27 | Disposition: A | Discharge status: Home / Self Care | Attending: Emergency Medicine

## 2019-05-27 MED ORDER — IBUPROFEN 200 MG PO TABS
200 MG | Freq: Once | ORAL | Status: AC
Start: 2019-05-27 — End: 2019-05-26
  Administered 2019-05-27: 03:00:00 600 mg via ORAL

## 2019-05-27 MED FILL — IBUPROFEN 200 MG PO TABS: 200 mg | ORAL | Qty: 1

## 2019-11-23 IMAGING — DX DG CHEST 2V
2 series · 2 of 2 positions shown · non-contrast
Comparison: Chest radiograph dated 10/07/2016

CLINICAL DATA: 42-year-old male with wheezing.

EXAM:
CHEST - 2 VIEW

[chest pa]
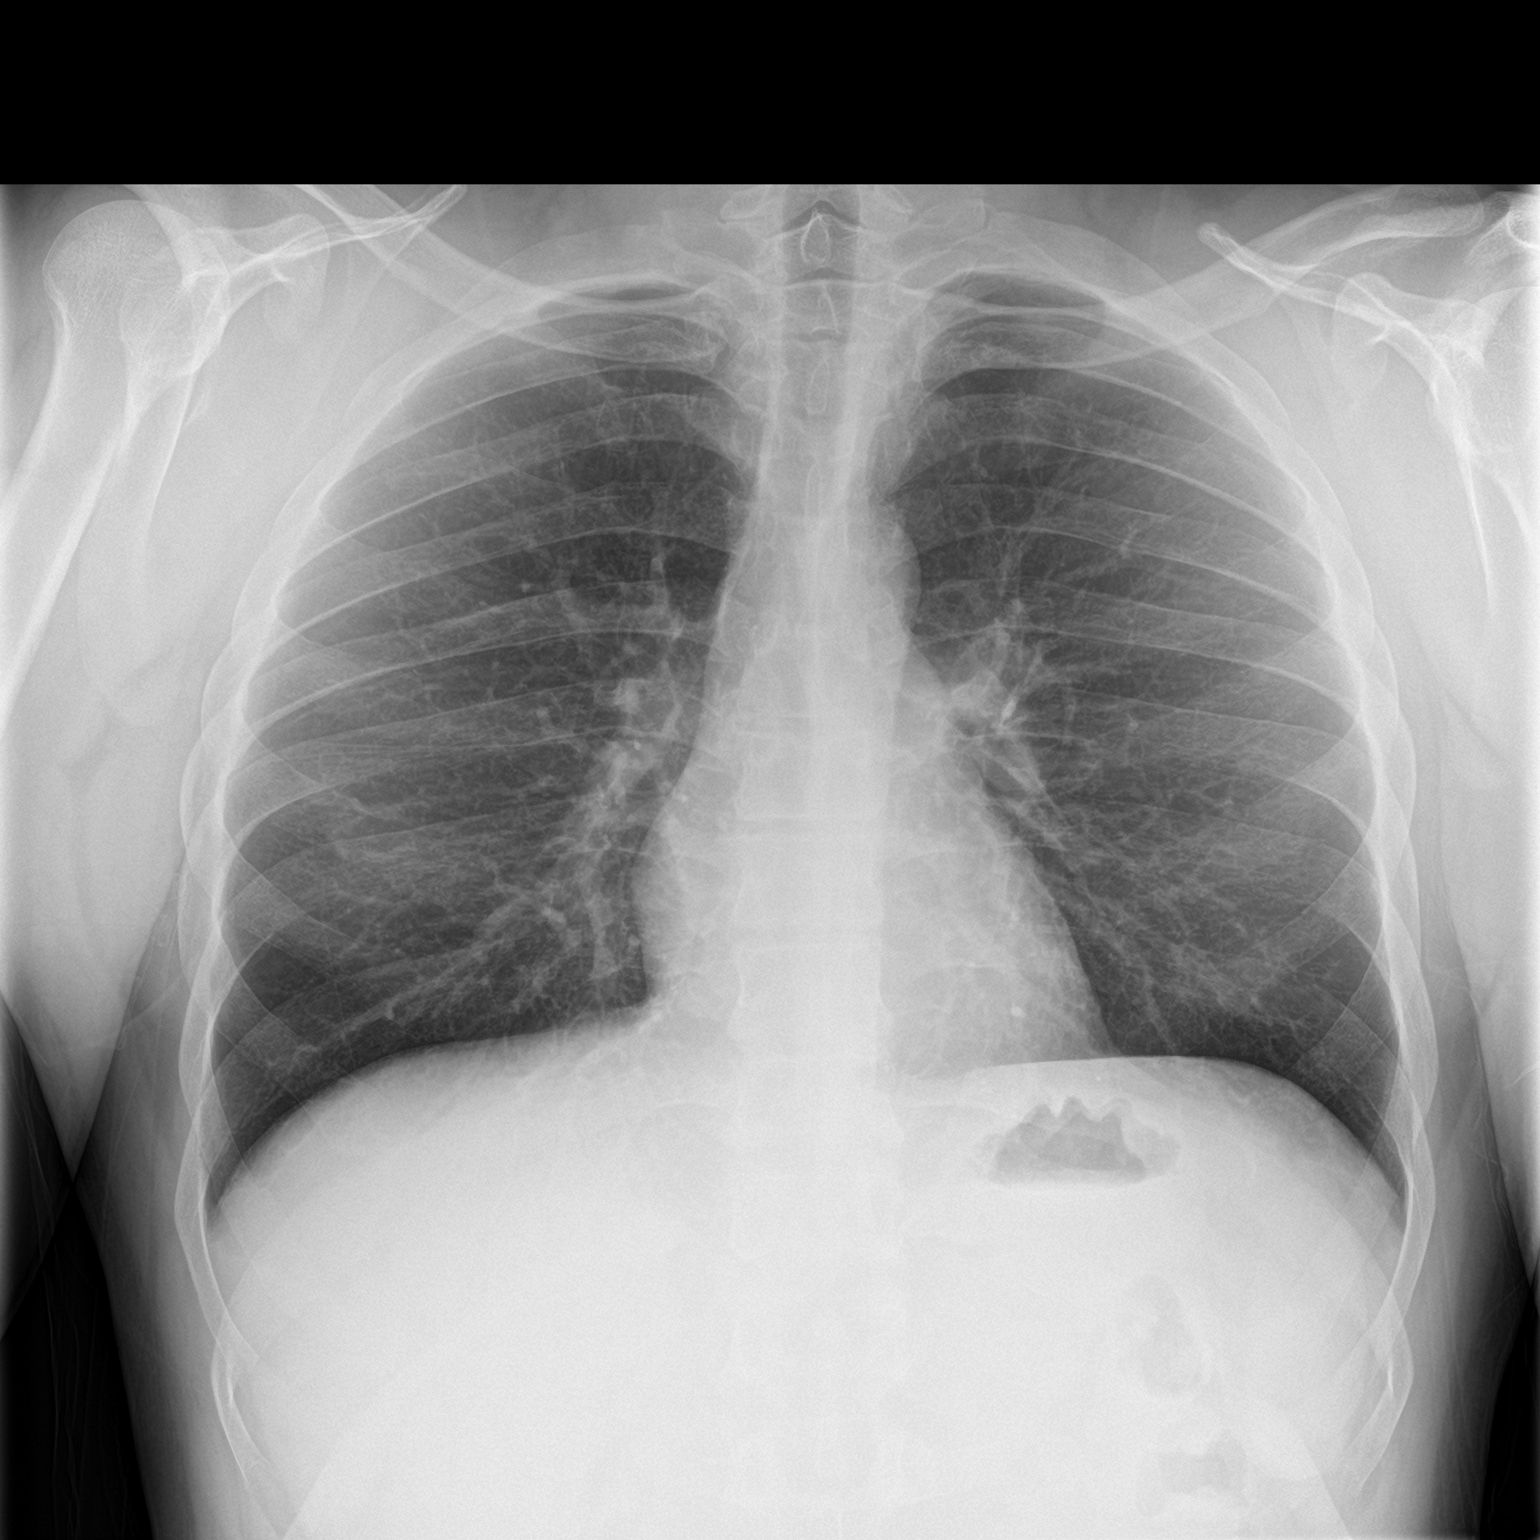

[chest lat]
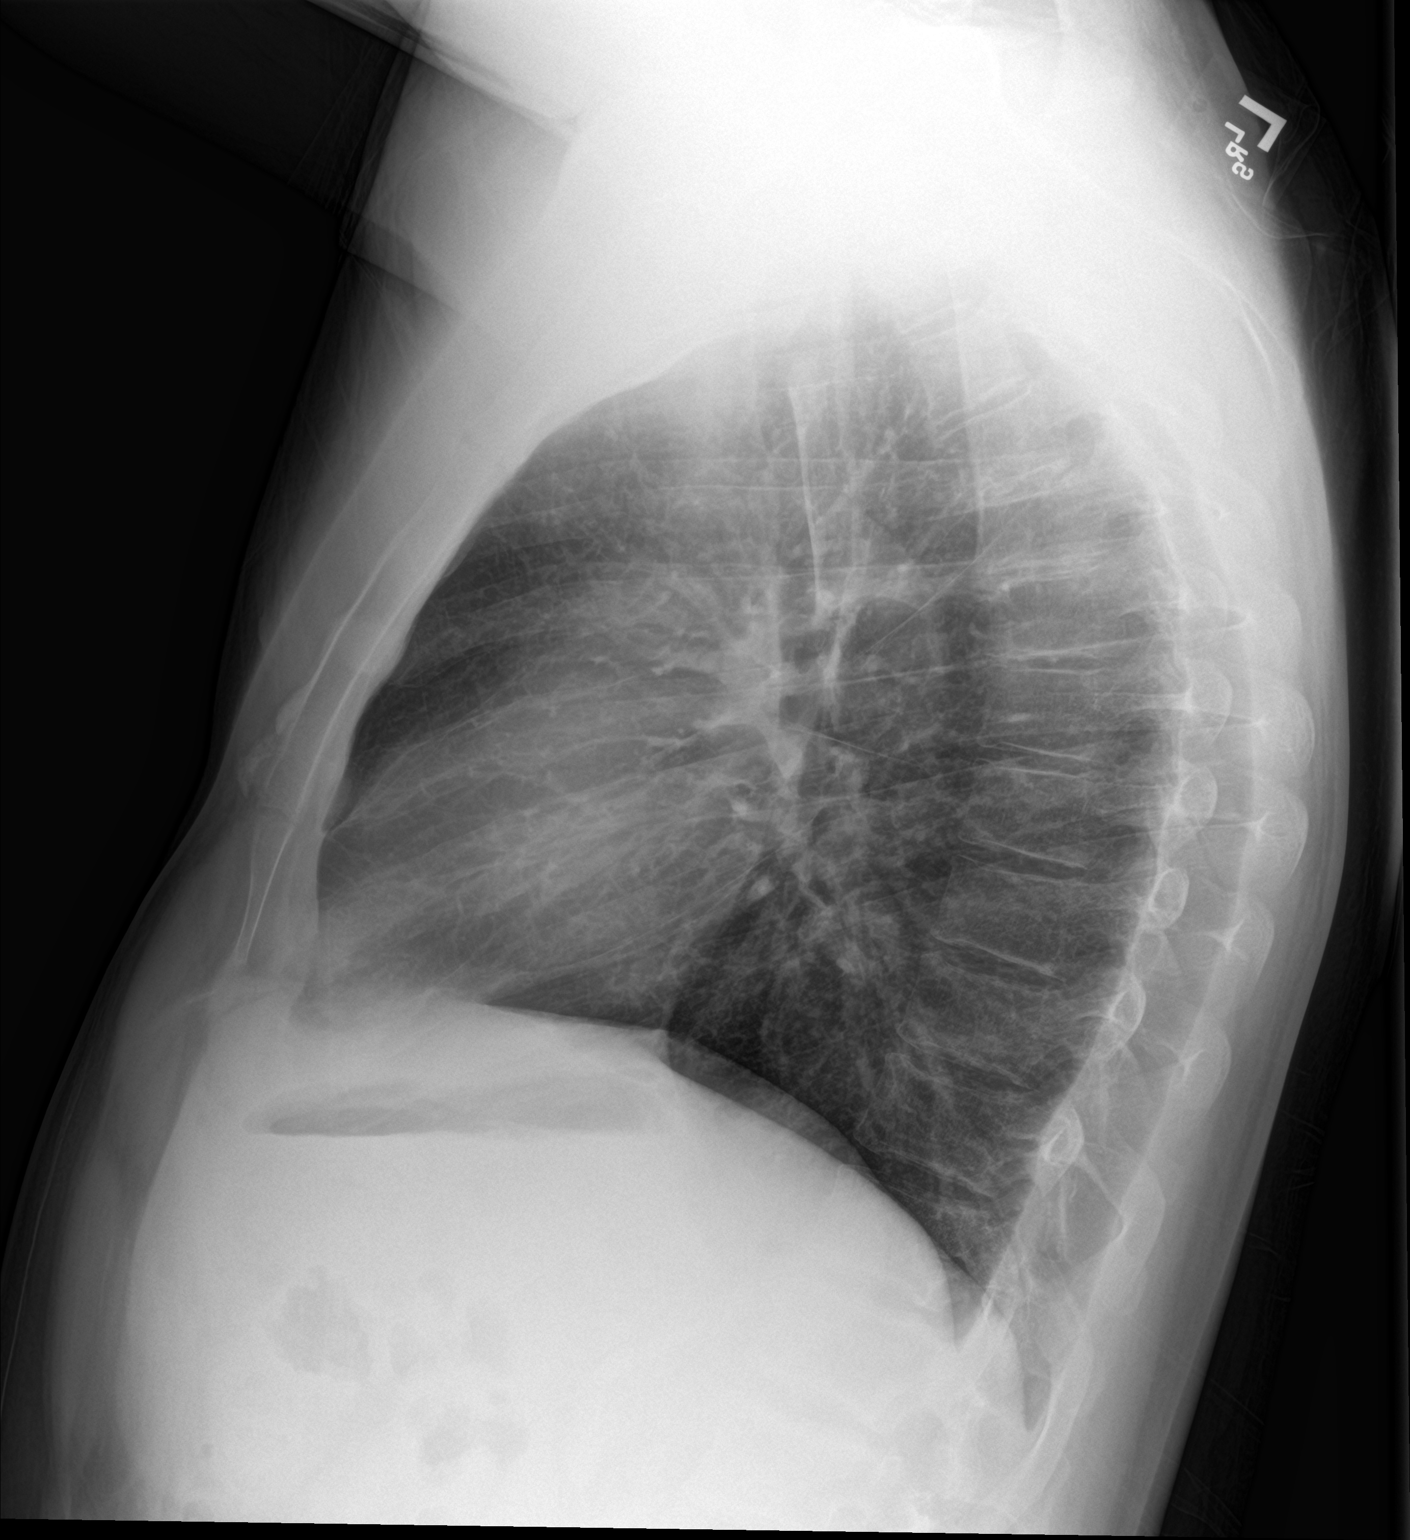

[2 of 2 positions shown; findings below may reference images not displayed]

FINDINGS: The heart size and mediastinal contours are within normal limits.
Both lungs are clear. The visualized skeletal structures are
unremarkable.
IMPRESSION: No active cardiopulmonary disease.

## 2019-12-09 ENCOUNTER — Other Ambulatory Visit: Payer: Self-pay | Admitting: Family Medicine

## 2020-01-09 DIAGNOSIS — B9681 Helicobacter pylori [H. pylori] as the cause of diseases classified elsewhere: Secondary | ICD-10-CM

## 2020-01-09 DIAGNOSIS — K2970 Gastritis, unspecified, without bleeding: Secondary | ICD-10-CM

## 2020-01-09 HISTORY — DX: Helicobacter pylori (H. pylori) as the cause of diseases classified elsewhere: K29.70

## 2020-01-09 HISTORY — DX: Helicobacter pylori (H. pylori) as the cause of diseases classified elsewhere: B96.81

## 2020-01-10 DIAGNOSIS — R0789 Other chest pain: Secondary | ICD-10-CM | POA: Diagnosis not present

## 2020-01-10 DIAGNOSIS — R062 Wheezing: Secondary | ICD-10-CM | POA: Diagnosis not present

## 2020-01-12 DIAGNOSIS — R0989 Other specified symptoms and signs involving the circulatory and respiratory systems: Secondary | ICD-10-CM | POA: Diagnosis not present

## 2020-01-30 DIAGNOSIS — Z20822 Contact with and (suspected) exposure to covid-19: Secondary | ICD-10-CM | POA: Diagnosis not present

## 2020-03-16 ENCOUNTER — Other Ambulatory Visit: Payer: Self-pay

## 2020-03-16 ENCOUNTER — Other Ambulatory Visit: Payer: Self-pay | Admitting: Family Medicine

## 2020-03-16 NOTE — Telephone Encounter (Signed)
Spoke with pt and advised last seen 04/02/2018, 30 d supply but would need to schedule an appointment within that timeframe. He explained his new benefits start next month and would be sure to give [place] a call. Nothing further needed.

## 2020-04-24 ENCOUNTER — Other Ambulatory Visit: Payer: Self-pay

## 2020-04-25 ENCOUNTER — Other Ambulatory Visit: Payer: Self-pay

## 2020-04-26 ENCOUNTER — Ambulatory Visit (INDEPENDENT_AMBULATORY_CARE_PROVIDER_SITE_OTHER): Payer: BC Managed Care – PPO | Admitting: Family Medicine

## 2020-04-26 ENCOUNTER — Encounter: Payer: Self-pay | Admitting: Family Medicine

## 2020-04-26 VITALS — BP 108/70 | HR 68 | Temp 97.8°F | Resp 16 | Ht 68.0 in | Wt 183.6 lb

## 2020-04-26 DIAGNOSIS — Z23 Encounter for immunization: Secondary | ICD-10-CM

## 2020-04-26 DIAGNOSIS — Z1159 Encounter for screening for other viral diseases: Secondary | ICD-10-CM

## 2020-04-26 DIAGNOSIS — J4540 Moderate persistent asthma, uncomplicated: Secondary | ICD-10-CM | POA: Diagnosis not present

## 2020-04-26 DIAGNOSIS — Z0000 Encounter for general adult medical examination without abnormal findings: Secondary | ICD-10-CM | POA: Diagnosis not present

## 2020-04-26 MED ORDER — ADVAIR DISKUS 250-50 MCG/DOSE IN AEPB: INHALATION_SPRAY | RESPIRATORY_TRACT | 11 refills | Status: DC

## 2020-04-26 MED ORDER — VENTOLIN HFA 108 (90 BASE) MCG/ACT IN AERS: INHALATION_SPRAY | RESPIRATORY_TRACT | 1 refills | Status: DC

## 2020-04-26 NOTE — Patient Instructions (Signed)

## 2020-04-26 NOTE — Progress Notes (Signed)
Office Note 04/26/2020  CC:  Chief Complaint  Patient presents with  . Annual Exam    Pt is fasting    HPI:  Carl Tucker is a 45 y.o. male who is here for annual health maintenance exam and f/u moderate persistent asthma.  Walking a lot at work but no formal exercise. Diet is fair.  Minimal to NO wheezing or cough or sob as long as he's taking advair 1 p bid.    ?smoking cigs?->yes, more the last year b/c inc anx/stress.  Had h pylori gastritis 01/2020, went to [facility] (h pylori IgG ab pos, cbc and cmet normal, ekg normal, cxr normal.  Took flagyl and tetracycline and all sx's resolved.   Past Medical History:  Diagnosis Date  . Moderate persistent asthma    Since infancy.  Was on advair at one time.  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  +URIs  . Seasonal allergies    much less problematic as an adult.  . Tobacco dependence     History reviewed. No pertinent surgical history.  Family History  Problem Relation Age of Onset  . Hypertension Mother   . Diabetes Maternal Grandmother   . Hypertension Maternal Grandmother   . Stroke Maternal Grandmother   . Asthma Maternal Grandfather   . Hypertension Maternal Grandfather   . Kidney disease Paternal Grandfather     Social History   Socioeconomic History  . Marital status: Single    Spouse name: Not on file  . Number of children: Not on file  . Years of education: Not on file  . Highest education level: Not on file  Occupational History  . Not on file  Tobacco Use  . Smoking status: Current Every Day Smoker    Packs/day: 0.50    Years: 12.00    Pack years: 6.00    Types: Cigarettes  . Smokeless tobacco: Never Used  Vaping Use  . Vaping Use: Former  Substance and Sexual Activity  . Alcohol use: Yes    Alcohol/week: 8.0 standard drinks    Types: 8 Cans of beer per week  . Drug use: No    Comment: quit smoking 2 weeks ago  . Sexual activity: Not on file  Other Topics Concern  . Not  on file  Social History Narrative   Single, no children.  3 sisters.   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   Tob: 10 pack-yr hx--current as of 06/2017.   Alc: 8 beers per week.   Social Determinants of Health   Financial Resource Strain: Not on file  Food Insecurity: Not on file  Transportation Needs: Not on file  Physical Activity: Not on file  Stress: Not on file  Social Connections: Not on file  Intimate Partner Violence: Not on file    Outpatient Medications Prior to Visit  Medication Sig Dispense Refill  . ADVAIR DISKUS 250-50 MCG/DOSE AEPB INHALE 1 DOSE BY MOUTH TWICE DAILY 60 each 0  . VENTOLIN HFA 108 (90 Base) MCG/ACT inhaler INHALE 2 PUFFS BY MOUTH EVERY 6 HOURS AS NEEDED FOR WHEEZING OR SHORTNESS OF BREATH 18 g 0  . metroNIDAZOLE (FLAGYL) 500 MG tablet Take 500 mg by mouth 3 (three) times daily. (Patient not taking: Reported on 04/26/2020)    . omeprazole (PRILOSEC) 20 MG capsule  (Patient not taking: Reported on 04/26/2020)    . tetracycline (SUMYCIN) 500 MG capsule Take 500 mg by mouth 4 (four) times daily. (Patient  not taking: Reported on 04/26/2020)     No facility-administered medications prior to visit.    Allergies  Allergen Reactions  . Peanut-Containing Drug Products Swelling    Swelling of the tongue    ROS Review of Systems  Constitutional: Negative for appetite change, chills, fatigue and fever.  HENT: Negative for congestion, dental problem, ear pain and sore throat.   Eyes: Negative for discharge, redness and visual disturbance.  Respiratory: Negative for cough, chest tightness, shortness of breath and wheezing.   Cardiovascular: Negative for chest pain, palpitations and leg swelling.  Gastrointestinal: Negative for abdominal pain, blood in stool, diarrhea, nausea and vomiting.  Genitourinary: Negative for difficulty urinating, dysuria, flank pain, frequency, hematuria and urgency.  Musculoskeletal: Negative for  arthralgias, back pain, joint swelling, myalgias and neck stiffness.  Skin: Negative for pallor and rash.  Neurological: Negative for dizziness, speech difficulty, weakness and headaches.  Hematological: Negative for adenopathy. Does not bruise/bleed easily.  Psychiatric/Behavioral: Negative for confusion and sleep disturbance. The patient is not nervous/anxious.    PE; Vitals with BMI 04/26/2020 04/02/2018 09/25/2017  Height 5\' 8"  5' 9.5" 5\' 9"   Weight 183 lbs 10 oz 194 lbs 2 oz 191 lbs 6 oz  BMI 27.92 28.27 28.25  Systolic 108 124  Diastolic 70 74 78  Pulse 68 79 85   Gen: Alert, well appearing.  Patient is oriented to person, place, time, and situation. AFFECT: pleasant, lucid thought and speech. ENT: Ears: EACs clear, normal epithelium.  TMs with good light reflex and landmarks bilaterally.  Eyes: no injection, icteris, swelling, or exudate.  EOMI, PERRLA. Nose: no drainage or turbinate edema/swelling.  No injection or focal lesion.  Mouth: lips without lesion/swelling.  Oral mucosa pink and moist.  Dentition intact and without obvious caries or gingival swelling.  Oropharynx without erythema, exudate, or swelling.  Neck: supple/nontender.  No LAD, mass, or TM.  Carotid pulses 2+ bilaterally, without bruits. CV: RRR, no m/r/g.   LUNGS: CTA bilat, nonlabored resps, good aeration in all lung fields. ABD: soft, NT, ND, BS normal.  No hepatospenomegaly or mass.  No bruits. EXT: no clubbing, cyanosis, or edema.  Musculoskeletal: no joint swelling, erythema, warmth, or tenderness.  ROM of all joints intact. Skin - no sores or suspicious lesions or rashes or color changes   Pertinent labs:  Lab Results  Component Value Date   TSH 1.40 04/02/2018   Lab Results  Component Value Date   WBC 6.6 04/02/2018   HGB 13.4 04/02/2018   HCT 38.6 04/02/2018   MCV 75.0 (L) 04/02/2018   PLT 269 04/02/2018  No results found for: IRON, TIBC, FERRITIN  Lab Results  Component Value Date    CREATININE 1.07 04/02/2018   BUN 12 04/02/2018   NA 139 04/02/2018   K 4.2 04/02/2018   CL 103 04/02/2018   CO2 25 04/02/2018   Lab Results  Component Value Date   ALT 36 04/02/2018   AST 21 04/02/2018   ALKPHOS 85 01/28/2011   BILITOT 0.8 04/02/2018   Lab Results  Component Value Date   CHOL 207 (H) 04/02/2018   Lab Results  Component Value Date   HDL 51 04/02/2018   Lab Results  Component Value Date   LDLCALC 138 (H) 04/02/2018   Lab Results  Component Value Date   TRIG 84 04/02/2018   Lab Results  Component Value Date   CHOLHDL 4.1 04/02/2018    ASSESSMENT AND PLAN:   1) Asthma, mod persistent. Doing  well on advair 250/50, 1 p bid---RFs x 1 yr today. Encouraged smoking cessation but he's not contemplating quitting at this time. Pneumovax 23 today and q 5 yrs.  2) Health maintenance exam: Reviewed age and gender appropriate health maintenance issues (prudent diet, regular exercise, health risks of tobacco and excessive alcohol, use of seatbelts, fire alarms in home, use of sunscreen).  Also reviewed age and gender appropriate health screening as well as vaccine recommendations. Vaccines: Flu->pt declined.  Covid->UTD.  Pneumovax 23->given today. Labs: HP labs ordered today (fasting).  Hep C screening as well (low risk). Prostate ca screening: average risk patient= as per latest guidelines, start screening at 45-50 yrs of age. Colon ca screening: average risk patient= as per latest guidelines, start screening at 45-50 yrs of age.  An After Visit Summary was printed and given to the patient.  FOLLOW UP:  Return in about 6 months (around 10/24/2020) for f/u asthma.  Signed:  Santiago Bumpers, MD           04/26/2020

## 2020-04-27 LAB — COMPREHENSIVE METABOLIC PANEL WITH GFR
ALT: 34 U/L (ref 0–53)
AST: 20 U/L (ref 0–37)
Albumin: 4.5 g/dL (ref 3.5–5.2)
Alkaline Phosphatase: 67 U/L (ref 39–117)
BUN: 12 mg/dL (ref 6–23)
CO2: 28 meq/L (ref 19–32)
Calcium: 9.5 mg/dL (ref 8.4–10.5)
Chloride: 101 meq/L (ref 96–112)
Creatinine, Ser: 1.07 mg/dL (ref 0.40–1.50)
GFR: 84.67 mL/min
Glucose, Bld: 79 mg/dL (ref 70–99)
Potassium: 4 meq/L (ref 3.5–5.1)
Sodium: 136 meq/L (ref 135–145)
Total Bilirubin: 0.9 mg/dL (ref 0.2–1.2)
Total Protein: 7.4 g/dL (ref 6.0–8.3)

## 2020-04-27 LAB — HEPATITIS C ANTIBODY
Hepatitis C Ab: NONREACTIVE
SIGNAL TO CUT-OFF: 0.01

## 2020-04-27 LAB — LIPID PANEL
Cholesterol: 158 mg/dL (ref 0–200)
HDL: 49.1 mg/dL
LDL Cholesterol: 90 mg/dL (ref 0–99)
NonHDL: 109.12
Total CHOL/HDL Ratio: 3
Triglycerides: 95 mg/dL (ref 0.0–149.0)
VLDL: 19 mg/dL (ref 0.0–40.0)

## 2020-04-27 LAB — CBC WITH DIFFERENTIAL/PLATELET
Basophils Absolute: 0 10*3/uL (ref 0.0–0.1)
Basophils Relative: 0.9 % (ref 0.0–3.0)
Eosinophils Absolute: 0.2 10*3/uL (ref 0.0–0.7)
Eosinophils Relative: 3.2 % (ref 0.0–5.0)
HCT: 39.2 % (ref 39.0–52.0)
Hemoglobin: 13.5 g/dL (ref 13.0–17.0)
Lymphocytes Relative: 42.3 % (ref 12.0–46.0)
Lymphs Abs: 2.2 10*3/uL (ref 0.7–4.0)
MCHC: 34.5 g/dL (ref 30.0–36.0)
MCV: 76.1 fl — ABNORMAL LOW (ref 78.0–100.0)
Monocytes Absolute: 0.3 10*3/uL (ref 0.1–1.0)
Monocytes Relative: 5.7 % (ref 3.0–12.0)
Neutro Abs: 2.5 10*3/uL (ref 1.4–7.7)
Neutrophils Relative %: 47.9 % (ref 43.0–77.0)
Platelets: 232 10*3/uL (ref 150.0–400.0)
RBC: 5.15 Mil/uL (ref 4.22–5.81)
RDW: 14.1 % (ref 11.5–15.5)
WBC: 5.3 10*3/uL (ref 4.0–10.5)

## 2020-04-27 LAB — TSH: TSH: 1.56 u[IU]/mL (ref 0.35–4.50)

## 2020-10-26 ENCOUNTER — Other Ambulatory Visit: Payer: Self-pay

## 2020-10-26 ENCOUNTER — Ambulatory Visit: Payer: BC Managed Care – PPO | Admitting: Family Medicine

## 2020-10-26 ENCOUNTER — Encounter: Payer: Self-pay | Admitting: Family Medicine

## 2020-10-26 VITALS — BP 131/80 | HR 84 | Temp 97.8°F | Ht 68.0 in | Wt 187.0 lb

## 2020-10-26 DIAGNOSIS — F17200 Nicotine dependence, unspecified, uncomplicated: Secondary | ICD-10-CM | POA: Diagnosis not present

## 2020-10-26 DIAGNOSIS — J4540 Moderate persistent asthma, uncomplicated: Secondary | ICD-10-CM

## 2020-10-26 DIAGNOSIS — S40011A Contusion of right shoulder, initial encounter: Secondary | ICD-10-CM | POA: Diagnosis not present

## 2020-10-26 MED ORDER — FLUTICASONE-SALMETEROL 250-50 MCG/ACT IN AEPB: INHALATION_SPRAY | RESPIRATORY_TRACT | 11 refills | Status: DC

## 2020-10-26 MED ORDER — VENTOLIN HFA 108 (90 BASE) MCG/ACT IN AERS: INHALATION_SPRAY | RESPIRATORY_TRACT | 1 refills | Status: DC

## 2020-10-26 NOTE — Progress Notes (Addendum)
OFFICE VISIT  10/26/2020  CC:  Chief Complaint  Patient presents with   Asthma    RCI;    Shoulder Injury    Pt c/o R shoulder injury after the back of trunk fall on shoulder at work yesterady   HPI:    Patient is a 45 y.o. African-American male who presents for 6 mo f/u moderate persistent asthma, tobacco dependence, and wants to discuss recent R shoulder injury. A/P as of last visit: "1) Asthma, mod persistent. Doing well on advair 250/50, 1 p bid---RFs x 1 yr today. Encouraged smoking cessation but he's not contemplating quitting at this time. Pneumovax 23 today and q 5 yrs.   2) Health maintenance exam: Reviewed age and gender appropriate health maintenance issues (prudent diet, regular exercise, health risks of tobacco and excessive alcohol, use of seatbelts, fire alarms in home, use of sunscreen).  Also reviewed age and gender appropriate health screening as well as vaccine recommendations. Vaccines: Flu->pt declined.  Covid->UTD.  Pneumovax 23->given today. Labs: HP labs ordered today (fasting).  Hep C screening as well (low risk). Prostate ca screening: average risk patient= as per latest guidelines, start screening at 45-50 yrs of age. Colon ca screening: average risk patient= as per latest guidelines, start screening at 45-50 yrs of age."  INTERIM HX: Doing well.  Asthma: takes advair 1p about every 2d. Rare morning wheezing requiring albut.  Tobacco: still smoking about 1/2 ppd.    Shoulder: yesterday his sliding back door of his truck at work fell down upon his R shoulder.  It's still a bit sore still but has good ROM.  No arm tingling or numbness.  No neck pain but a little stiff this morning--resolved quickly.  No LOC. No HA. He has not had to take any otc pain med.   Past Medical History:  Diagnosis Date   Helicobacter pylori gastritis 01/2020   tetra+metron tx and all sx's resolved ([facility])   Moderate persistent asthma    Since infancy.  Was on advair at  one time.  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  +URIs   Seasonal allergies    much less problematic as an adult.   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 No pertinent surgical history.  Outpatient Medications Prior to Visit  Medication Sig Dispense Refill   ADVAIR DISKUS 250-50 MCG/DOSE AEPB 1 p bid 60 each 11   VENTOLIN HFA 108 (90 Base) MCG/ACT inhaler INHALE 2 PUFFS BY MOUTH EVERY 4 HOURS AS NEEDED FOR WHEEZING OR SHORTNESS OF BREATH 18 g 1   No facility-administered medications prior to visit.    Allergies  Allergen Reactions   Peanut-Containing Drug Products Swelling    Swelling of the tongue    ROS As per HPI  PE: Vitals with BMI 10/26/2020 10/26/2020 04/26/2020  Height - 5\' 8"  5\' 8"   Weight - 187 lbs 183 lbs 10 oz  BMI - 28.44 27.92  Systolic 131 143  Diastolic 80 80 70  Pulse - 84 68   Gen: Alert, well appearing.  Patient is oriented to person, place, time, and situation. AFFECT: pleasant, lucid thought and speech. Head: atraumatic. R shoulder w/out any deformity or tenderness to palpation.  Neck with full ROM and no tenderness. R shoulder fully intact ROM w/out pain.  Strength in UEs 5/5 prox/dist. No sensory deficit. CV: RRR, no m/r/g.   LUNGS: CTA bilat, nonlabored resps,  good aeration in all lung fields. EXT: no clubbing or cyanosis.  no edema.    LABS:  Lab Results  Component Value Date   TSH 1.56 04/26/2020   Lab Results  Component Value Date   WBC 5.3 04/26/2020   HGB 13.5 04/26/2020   HCT 39.2 04/26/2020   MCV 76.1 (L) 04/26/2020   PLT 232.0 04/26/2020  No results found for: IRON, TIBC, FERRITIN  Lab Results  Component Value Date   CREATININE 1.07 04/26/2020   BUN 12 04/26/2020   NA 136 04/26/2020   K 4.0 04/26/2020   CL 101 04/26/2020   CO2 28 04/26/2020   Lab Results  Component Value Date   ALT 34 04/26/2020   AST 20 04/26/2020   ALKPHOS  67 04/26/2020   BILITOT 0.9 04/26/2020   Lab Results  Component Value Date   CHOL 158 04/26/2020   Lab Results  Component Value Date   HDL 49.10 04/26/2020   Lab Results  Component Value Date   LDLCALC 90 04/26/2020   Lab Results  Component Value Date   TRIG 95.0 04/26/2020   Lab Results  Component Value Date   CHOLHDL 3 04/26/2020   IMPRESSION AND PLAN:  1) Moderate persistent asthma, well controlled on advair 3-4 days a week. Continue this.  New advair and albut rx's sent in today.  2) R shoulder contusion: exam normal today. He is feeling minimal effect from this.  3) Tobacco dependence: encouraged complete cessation. Right now he prefers the approach of gradually cutting back on use.  An After Visit Summary was printed and given to the patient.  FOLLOW UP: Return in about 6 months (around 04/28/2021) for annual CPE (fasting).  Signed:  Santiago Bumpers, MD           10/26/2020

## 2021-03-20 DIAGNOSIS — Z20822 Contact with and (suspected) exposure to covid-19: Secondary | ICD-10-CM | POA: Diagnosis not present

## 2021-03-20 DIAGNOSIS — U071 COVID-19: Secondary | ICD-10-CM | POA: Diagnosis not present

## 2022-01-19 ENCOUNTER — Other Ambulatory Visit: Payer: Self-pay | Admitting: Family Medicine

## 2022-01-20 ENCOUNTER — Telehealth: Payer: Self-pay

## 2022-01-20 MED ORDER — ALBUTEROL SULFATE HFA 108 (90 BASE) MCG/ACT IN AERS: 2.0000 | INHALATION_SPRAY | Freq: Four times a day (QID) | RESPIRATORY_TRACT | 0 refills | Status: DC | PRN

## 2022-01-20 NOTE — Telephone Encounter (Signed)
Okay, generic albuterol inhaler sent

## 2022-01-20 NOTE — Telephone Encounter (Signed)
Spoke with pharmacy regarding medication change. Insurance does not cover Albuterol.  Please advise of alternative

## 2022-01-20 NOTE — Telephone Encounter (Signed)
[facility] calling about inhaler.  Written as DAW1 Need to change 0 so he can get generic  Please call [facility] [phone]

## 2022-01-28 ENCOUNTER — Ambulatory Visit: Payer: BC Managed Care – PPO | Admitting: Family Medicine

## 2022-01-28 NOTE — Progress Notes (Deleted)
OFFICE VISIT  01/28/2022  CC: No chief complaint on file.   Patient is a 46 y.o. male who presents for neck pain.  HPI: ***   Past Medical History:  Diagnosis Date   Helicobacter pylori gastritis 01/2020   tetra+metron tx and all sx's resolved ([facility])   Moderate persistent asthma    Since infancy.  Was on advair at one time.  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  +URIs   Seasonal allergies    much less problematic as an adult.   Thalassemia carrier    suspected->chronic mildly low MCV and normal Hb   Tobacco dependence     No past surgical history on file.  Outpatient Medications Prior to Visit  Medication Sig Dispense Refill   albuterol (VENTOLIN HFA) 108 (90 Base) MCG/ACT inhaler Inhale 2 puffs into the lungs every 6 (six) hours as needed for wheezing or shortness of breath. 18 g 0   fluticasone-salmeterol (ADVAIR DISKUS) 250-50 MCG/ACT AEPB 1 p bid 60 each 11   No facility-administered medications prior to visit.    Allergies  Allergen Reactions   Peanut-Containing Drug Products Swelling    Swelling of the tongue    ROS As per HPI  PE:    10/26/2020    2:38 PM 10/26/2020    2:37 PM 04/26/2020    2:04 PM  Vitals with BMI  Height  5\' 8"  5\' 8"   Weight  187 lbs 183 lbs 10 oz  BMI  28.44 27.92  Systolic 131 143  Diastolic 80 80 70  Pulse  84 68     Physical Exam  ***  LABS:  Last CBC Lab Results  Component Value Date   WBC 5.3 04/26/2020   HGB 13.5 04/26/2020   HCT 39.2 04/26/2020   MCV 76.1 (L) 04/26/2020   MCH 26.0 (L) 04/02/2018   RDW 14.1 04/26/2020   PLT 232.0 04/26/2020   Last metabolic panel Lab Results  Component Value Date   GLUCOSE 79 04/26/2020   NA 136 04/26/2020   K 4.0 04/26/2020   CL 101 04/26/2020   CO2 28 04/26/2020   BUN 12 04/26/2020   CREATININE 1.07 04/26/2020   CALCIUM 9.5 04/26/2020   PROT 7.4 04/26/2020   ALBUMIN 4.5 04/26/2020   BILITOT 0.9 04/26/2020   ALKPHOS 67 04/26/2020   AST 20  04/26/2020   ALT 34 04/26/2020   IMPRESSION AND PLAN:  No problem-specific Assessment & Plan notes found for this encounter.   An After Visit Summary was printed and given to the patient.  FOLLOW UP: No follow-ups on file.  Signed:  04/28/2020, MD           01/28/2022

## 2022-02-06 ENCOUNTER — Ambulatory Visit (INDEPENDENT_AMBULATORY_CARE_PROVIDER_SITE_OTHER): Payer: Commercial Managed Care - HMO | Admitting: Family Medicine

## 2022-02-06 ENCOUNTER — Encounter: Payer: Self-pay | Admitting: Family Medicine

## 2022-02-06 VITALS — BP 131/86 | HR 70 | Temp 98.2°F | Wt 190.8 lb

## 2022-02-06 DIAGNOSIS — M542 Cervicalgia: Secondary | ICD-10-CM | POA: Diagnosis not present

## 2022-02-06 NOTE — Progress Notes (Signed)
OFFICE VISIT  02/06/2022  CC:  Chief Complaint  Patient presents with   Neck Pain    Few months.     Patient is a 46 y.o. male who presents for neck pain.  HPI: Approximately 6 weeks ago he started to have pain in the back of his neck that was worse when he woke up in the morning but did persist some during the day.  Range of motion of the neck is somewhat difficult and stiff. No preceding injury or overuse.  Stress level has been unchanged. No radiation of the pain down the arms or into the mid or lower back.  No paresthesias or upper extremity or lower extremity weakness. Takes aspirin occasionally for the pain and it helps some.  Otherwise he has been feeling very well.  PMP AWARE reviewed today: nothing is listed.  Past Medical History:  Diagnosis Date   Helicobacter pylori gastritis 01/2020   tetra+metron tx and all sx's resolved ([facility])   Moderate persistent asthma    Since infancy.  Was on advair at one time.  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  +URIs   Seasonal allergies    much less problematic as an adult.   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 No pertinent surgical history.  Outpatient Medications Prior to Visit  Medication Sig Dispense Refill   albuterol (VENTOLIN HFA) 108 (90 Base) MCG/ACT inhaler Inhale 2 puffs into the lungs every 6 (six) hours as needed for wheezing or shortness of breath. 18 g 0   fluticasone-salmeterol (ADVAIR DISKUS) 250-50 MCG/ACT AEPB 1 p bid 60 each 11   No facility-administered medications prior to visit.    Allergies  Allergen Reactions   Peanut-Containing Drug Products Swelling    Swelling of the tongue    ROS As per HPI  PE:    02/06/2022    2:39 PM 10/26/2020    2:38 PM 10/26/2020    2:37 PM  Vitals with BMI  Height   5\' 8"   Weight 190 lbs 13 oz  187 lbs  BMI   28.44  Systolic 131 131  Diastolic 86 80 80  Pulse  70  84     Physical Exam  Gen: Alert, well appearing.  Patient is oriented to person, place, time, and situation. AFFECT: pleasant, lucid thought and speech. No C-spine tenderness. Neck flexion fully intact but elicits mild pain and stiffness in the back of the neck.  Lateral bending a bit limited, about 45 degrees each way.  Rotation limited to about 45 degrees each way. Upper extremity strength 5 out of 5 proximally and distally bilaterally.  LABS:  Last CBC Lab Results  Component Value Date   WBC 5.3 04/26/2020   HGB 13.5 04/26/2020   HCT 39.2 04/26/2020   MCV 76.1 (L) 04/26/2020   MCH 26.0 (L) 04/02/2018   RDW 14.1 04/26/2020   PLT 232.0 04/26/2020   Last metabolic panel Lab Results  Component Value Date   GLUCOSE 79 04/26/2020   NA 136 04/26/2020   K 4.0 04/26/2020   CL 101 04/26/2020   CO2 28 04/26/2020   BUN 12 04/26/2020   CREATININE 1.07 04/26/2020   CALCIUM 9.5 04/26/2020   PROT 7.4 04/26/2020   ALBUMIN 4.5 04/26/2020   BILITOT 0.9 04/26/2020   ALKPHOS 67 04/26/2020   AST 20 04/26/2020   ALT 34 04/26/2020   IMPRESSION AND PLAN:  Cervicalgia. Check C-spine plain films. Recommended physical therapy, patient agreeable, ordered today. Okay to continue as needed OTC NSAID. Apply heat.  An After Visit Summary was printed and given to the patient.  FOLLOW UP: Return in about 6 weeks (around 03/20/2022) for cpe and f/u neck pain.  Signed:  Santiago Bumpers, MD           02/06/2022

## 2022-02-06 NOTE — Patient Instructions (Signed)
For your neck x-ray go to [facility] imaging on [address]. No appointment is needed.

## 2022-02-18 ENCOUNTER — Other Ambulatory Visit: Payer: Self-pay

## 2022-02-18 ENCOUNTER — Telehealth: Payer: Self-pay

## 2022-02-18 MED ORDER — FLUTICASONE-SALMETEROL 250-50 MCG/ACT IN AEPB: INHALATION_SPRAY | RESPIRATORY_TRACT | 11 refills | Status: DC

## 2022-02-18 NOTE — Telephone Encounter (Signed)
Patient was seen on 11/30.  Patient thought Dr. Milinda Cave was sending in prescription for Advair.  Please send prescription to [facility]

## 2022-02-18 NOTE — Telephone Encounter (Signed)
Patient advised new prescription was sent

## 2022-02-19 ENCOUNTER — Other Ambulatory Visit: Payer: Self-pay

## 2022-02-21 MED ORDER — FLUTICASONE-SALMETEROL 250-50 MCG/ACT IN AEPB: INHALATION_SPRAY | RESPIRATORY_TRACT | 11 refills | Status: DC

## 2022-03-04 ENCOUNTER — Ambulatory Visit: Payer: Commercial Managed Care - HMO | Admitting: Physical Therapy

## 2022-03-04 NOTE — Therapy (Incomplete)
OUTPATIENT PHYSICAL THERAPY CERVICAL EVALUATION   Patient Name: Carl Tucker MRN: [id number] DOB:1975/10/06, 46 y.o., male Today's Date: 03/04/2022  END OF SESSION:   Past Medical History:  Diagnosis Date   Helicobacter pylori gastritis 01/2020   tetra+metron tx and all sx's resolved ([facility])   Moderate persistent asthma    Since infancy.  Was on advair at one time.  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  +URIs   Seasonal allergies    much less problematic as an adult.   Thalassemia carrier    suspected->chronic mildly low MCV and normal Hb   Tobacco dependence    No past surgical history on file. Patient Active Problem List   Diagnosis Date Noted   Bronchitis, acute 01/29/2011   GERD (gastroesophageal reflux disease) 01/29/2011   Tobacco abuse 01/29/2011   Asthma exacerbation 01/28/2011    Class: Acute    PCP: Jeoffrey Massed, MD  REFERRING PROVIDER: Jeoffrey Massed, MD  REFERRING DIAG: Cervicalgia  THERAPY DIAG:  No diagnosis found.  Rationale for Evaluation and Treatment: Rehabilitation  ONSET DATE: ***   SUBJECTIVE: SUBJECTIVE STATEMENT: ***  PERTINENT HISTORY:  ***  PAIN:  Are you having pain? Yes:  NPRS scale: ***/10 Pain location: *** Pain description: *** Aggravating factors: *** Relieving factors: ***  PRECAUTIONS: None  WEIGHT BEARING RESTRICTIONS: No  FALLS:  Has patient fallen in last 6 months? {fallsyesno:27318}  LIVING ENVIRONMENT: Lives with: {[facility] lives with:25569::"lives with their family"} Lives in: {Lives in:25570} Stairs: {opstairs:27293} Has following equipment at home: {Assistive devices:23999}  OCCUPATION: ***  PLOF: Independent  PATIENT GOALS: ***   OBJECTIVE:  PATIENT SURVEYS:  FOTO ***  COGNITION: Overall cognitive status: Within functional limits for tasks assessed  SENSATION: {sensation:27233}  POSTURE:  {posture:25561}  PALPATION: ***   CERVICAL ROM:   {AROM/PROM:27142}  ROM A/PROM (deg) eval  Flexion   Extension   Right lateral flexion   Left lateral flexion   Right rotation   Left rotation    (Blank rows = not tested)  UPPER EXTREMITY ROM:  {AROM/PROM:27142} ROM Right eval Left eval  Shoulder flexion    Shoulder extension    Shoulder abduction    Shoulder internal rotation    Shoulder external rotation     (Blank rows = not tested)  UPPER EXTREMITY MMT:  MMT Right eval Left eval  Shoulder flexion    Shoulder extension    Shoulder abduction    Shoulder adduction    Shoulder extension    Shoulder internal rotation    Shoulder external rotation    Middle trapezius    Lower trapezius    Elbow flexion    Elbow extension    Wrist flexion    Wrist extension    Wrist ulnar deviation    Wrist radial deviation    Wrist pronation    Wrist supination    Grip strength     (Blank rows = not tested)  CERVICAL SPECIAL TESTS:  {Cervical special tests:25246}  FUNCTIONAL TESTS:  {Functional tests:24029}   TODAY'S TREATMENT:       [facility] Adult PT Treatment:                                                DATE: 03/04/2022 Therapeutic Exercise: ***  PATIENT EDUCATION:  Education details: Exam findings, POC, HEP Person educated: Patient Education method:  Explanation, Demonstration, Tactile cues, Verbal cues, and Handouts Education comprehension: verbalized understanding, returned demonstration, verbal cues required, tactile cues required, and needs further education  HOME EXERCISE PROGRAM: ***   ASSESSMENT: CLINICAL IMPRESSION: Patient is a 46 y.o. male who was seen today for physical therapy evaluation and treatment for ***.   OBJECTIVE IMPAIRMENTS: {opptimpairments:25111}.   ACTIVITY LIMITATIONS: {activitylimitations:27494}  PARTICIPATION LIMITATIONS: {participationrestrictions:25113}  PERSONAL FACTORS: {Personal factors:25162} are also affecting patient's functional outcome.   REHAB POTENTIAL:  {rehabpotential:25112}  CLINICAL DECISION MAKING: {clinical decision making:25114}  EVALUATION COMPLEXITY: {Evaluation complexity:25115}   GOALS: Goals reviewed with patient? {yes/no:20286}  SHORT TERM GOALS: Target date: ***  *** Baseline: *** Goal status: {GOALSTATUS:25110}  2.  *** Baseline: *** Goal status: {GOALSTATUS:25110}  3.  *** Baseline: *** Goal status: {GOALSTATUS:25110}  LONG TERM GOALS: Target date: ***  *** Baseline: *** Goal status: {GOALSTATUS:25110}  2.  *** Baseline: *** Goal status: {GOALSTATUS:25110}  3.  *** Baseline: *** Goal status: {GOALSTATUS:25110}  4.  *** Baseline: *** Goal status: {GOALSTATUS:25110}   PLAN: PT FREQUENCY: {rehab frequency:25116}  PT DURATION: {rehab duration:25117}  PLANNED INTERVENTIONS: {rehab planned interventions:25118::"Therapeutic exercises","Therapeutic activity","Neuromuscular re-education","Balance training","Gait training","Patient/Family education","Self Care","Joint mobilization"}  PLAN FOR NEXT SESSION: ***   Rosana Hoes, PT, DPT, LAT, ATC 03/04/22  8:11 AM Phone: [phone] Fax: [phone]

## 2022-03-17 ENCOUNTER — Ambulatory Visit: Payer: Commercial Managed Care - HMO | Admitting: Physical Therapy

## 2022-03-20 ENCOUNTER — Encounter: Payer: Self-pay | Admitting: Family Medicine

## 2022-03-20 ENCOUNTER — Ambulatory Visit (INDEPENDENT_AMBULATORY_CARE_PROVIDER_SITE_OTHER): Payer: Commercial Managed Care - HMO | Admitting: Family Medicine

## 2022-03-20 VITALS — BP 125/79 | HR 72 | Temp 98.0°F | Ht 69.29 in | Wt 192.8 lb

## 2022-03-20 DIAGNOSIS — Z1211 Encounter for screening for malignant neoplasm of colon: Secondary | ICD-10-CM | POA: Diagnosis not present

## 2022-03-20 DIAGNOSIS — Z0000 Encounter for general adult medical examination without abnormal findings: Secondary | ICD-10-CM

## 2022-03-20 DIAGNOSIS — Z23 Encounter for immunization: Secondary | ICD-10-CM | POA: Diagnosis not present

## 2022-03-20 NOTE — Patient Instructions (Signed)
Health Maintenance, Male Adopting a healthy lifestyle and getting preventive care are important in promoting health and wellness. Ask your health care provider about: The right schedule for you to have regular tests and exams. Things you can do on your own to prevent diseases and keep yourself healthy. What should I know about diet, weight, and exercise? Eat a healthy diet  Eat a diet that includes plenty of vegetables, fruits, low-fat dairy products, and lean protein. Do not eat a lot of foods that are high in solid fats, added sugars, or sodium. Maintain a healthy weight Body mass index (BMI) is a measurement that can be used to identify possible weight problems. It estimates body fat based on height and weight. Your health care provider can help determine your BMI and help you achieve or maintain a healthy weight. Get regular exercise Get regular exercise. This is one of the most important things you can do for your health. Most adults should: Exercise for at least 150 minutes each week. The exercise should increase your heart rate and make you sweat (moderate-intensity exercise). Do strengthening exercises at least twice a week. This is in addition to the moderate-intensity exercise. Spend less time sitting. Even light physical activity can be beneficial. Watch cholesterol and blood lipids Have your blood tested for lipids and cholesterol at 46 years of age, then have this test every 5 years. You may need to have your cholesterol levels checked more often if: Your lipid or cholesterol levels are high. You are older than 46 years of age. You are at high risk for heart disease. What should I know about cancer screening? Many types of cancers can be detected early and may often be prevented. Depending on your health history and family history, you may need to have cancer screening at various ages. This may include screening for: Colorectal cancer. Prostate cancer. Skin cancer. Lung  cancer. What should I know about heart disease, diabetes, and high blood pressure? Blood pressure and heart disease High blood pressure causes heart disease and increases the risk of stroke. This is more likely to develop in people who have high blood pressure readings or are overweight. Talk with your health care provider about your target blood pressure readings. Have your blood pressure checked: Every 3-5 years if you are 18-39 years of age. Every year if you are 40 years old or older. If you are between the ages of 65 and 75 and are a current or former smoker, ask your health care provider if you should have a one-time screening for abdominal aortic aneurysm (AAA). Diabetes Have regular diabetes screenings. This checks your fasting blood sugar level. Have the screening done: Once every three years after age 45 if you are at a normal weight and have a low risk for diabetes. More often and at a younger age if you are overweight or have a high risk for diabetes. What should I know about preventing infection? Hepatitis B If you have a higher risk for hepatitis B, you should be screened for this virus. Talk with your health care provider to find out if you are at risk for hepatitis B infection. Hepatitis C Blood testing is recommended for: Everyone born from 1945 through 1965. Anyone with known risk factors for hepatitis C. Sexually transmitted infections (STIs) You should be screened each year for STIs, including gonorrhea and chlamydia, if: You are sexually active and are younger than 46 years of age. You are older than 46 years of age and your   health care provider tells you that you are at risk for this type of infection. Your sexual activity has changed since you were last screened, and you are at increased risk for chlamydia or gonorrhea. Ask your health care provider if you are at risk. Ask your health care provider about whether you are at high risk for HIV. Your health care provider  may recommend a prescription medicine to help prevent HIV infection. If you choose to take medicine to prevent HIV, you should first get tested for HIV. You should then be tested every 3 months for as long as you are taking the medicine. Follow these instructions at home: Alcohol use Do not drink alcohol if your health care provider tells you not to drink. If you drink alcohol: Limit how much you have to 0-2 drinks a day. Know how much alcohol is in your drink. In the [place] one drink equals one 12 oz bottle of beer (355 mL), one 5 oz glass of wine (148 mL), or one 1 oz glass of hard liquor (44 mL). Lifestyle Do not use any products that contain nicotine or tobacco. These products include cigarettes, chewing tobacco, and vaping devices, such as e-cigarettes. If you need help quitting, ask your health care provider. Do not use street drugs. Do not share needles. Ask your health care provider for help if you need support or information about quitting drugs. General instructions Schedule regular health, dental, and eye exams. Stay current with your vaccines. Tell your health care provider if: You often feel depressed. You have ever been abused or do not feel safe at home. Summary Adopting a healthy lifestyle and getting preventive care are important in promoting health and wellness. Follow your health care provider's instructions about healthy diet, exercising, and getting tested or screened for diseases. Follow your health care provider's instructions on monitoring your cholesterol and blood pressure. This information is not intended to replace advice given to you by your health care provider. Make sure you discuss any questions you have with your health care provider. Document Revised: 07/16/2020 Document Reviewed: 07/16/2020 Elsevier Patient Education  2023 Elsevier Inc.  

## 2022-03-20 NOTE — Progress Notes (Signed)
Office Note 03/20/2022  CC:  Chief Complaint  Patient presents with   Annual Exam    Pt is fasting   Patient is a 47 y.o. male who is here for annual health maintenance exam and 6 wk f/u neck pain. A/P as of last visit: "Cervicalgia. Check C-spine plain films. Recommended physical therapy, patient agreeable, ordered today. Okay to continue as needed OTC NSAID. Apply heat."  INTERIM HX: Neck is feeling better. Plans on getting his x-ray tomorrow. He has not done the PT, says he is ok w/out it for now.  No other concerns.   Past Medical History:  Diagnosis Date   Helicobacter pylori gastritis 01/2020   tetra+metron tx and all sx's resolved ([facility])   Moderate persistent asthma    Since infancy.  Was on advair at one time.  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  +URIs   Seasonal allergies    much less problematic as an adult.   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 No pertinent surgical history.  Family History  Problem Relation Age of Onset   Hypertension Mother    Diabetes Maternal Grandmother    Hypertension Maternal Grandmother    Stroke Maternal Grandmother    Asthma Maternal Grandfather    Hypertension Maternal Grandfather    Kidney disease Paternal Grandfather     Social History   Socioeconomic History   Marital status: Single    Spouse name: Not on file   Number of children: Not on file   Years of education: Not on file   Highest education level: Not on file  Occupational History   Not on file  Tobacco Use   Smoking status: Every Day    Packs/day: 0.50    Years: 12.00    Total pack years: 6.00    Types: Cigarettes   Smokeless tobacco: Never  Vaping Use   Vaping Use: Former  Substance and Sexual Activity   Alcohol use: Yes    Alcohol/week: 8.0 standard drinks of alcohol    Types: 8 Cans of beer per week   Drug use: No    Comment: quit smoking 2  weeks ago   Sexual activity: Not on file  Other Topics Concern   Not on file  Social History Narrative   Single, no children.  3 sisters.   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   Tob: 10 pack-yr hx--current as of 06/2017.   Alc: 8 beers per week.   Social Determinants of Health   Financial Resource Strain: Not on file  Food Insecurity: Not on file  Transportation Needs: Not on file  Physical Activity: Not on file  Stress: Not on file  Social Connections: Not on file  Intimate Partner Violence: Not on file    Outpatient Medications Prior to Visit  Medication Sig Dispense Refill   albuterol (VENTOLIN HFA) 108 (90 Base) MCG/ACT inhaler Inhale 2 puffs into the lungs every 6 (six) hours as needed for wheezing or shortness of breath. 18 g 0   fluticasone-salmeterol (ADVAIR DISKUS) 250-50 MCG/ACT AEPB 1 p bid 60 each 11   No facility-administered medications prior to visit.    Allergies  Allergen Reactions   Peanut-Containing Drug Products Swelling    Swelling of the tongue    Review of Systems  Constitutional:  Negative for appetite change, chills, fatigue and fever.  HENT:  Negative  for congestion, dental problem, ear pain and sore throat.   Eyes:  Negative for discharge, redness and visual disturbance.  Respiratory:  Negative for cough, chest tightness, shortness of breath and wheezing.   Cardiovascular:  Negative for chest pain, palpitations and leg swelling.  Gastrointestinal:  Negative for abdominal pain, blood in stool, diarrhea, nausea and vomiting.  Genitourinary:  Negative for difficulty urinating, dysuria, flank pain, frequency, hematuria and urgency.  Musculoskeletal:  Negative for arthralgias, back pain, joint swelling, myalgias and neck stiffness.  Skin:  Negative for pallor and rash.  Neurological:  Negative for dizziness, speech difficulty, weakness and headaches.  Hematological:  Negative for adenopathy. Does not  bruise/bleed easily.  Psychiatric/Behavioral:  Negative for confusion and sleep disturbance. The patient is not nervous/anxious.     PE;    03/20/2022    2:02 PM 02/06/2022    2:39 PM 10/26/2020    2:38 PM  Vitals with BMI  Height 5' 9.291"    Weight 192 lbs 13 oz 190 lbs 13 oz   BMI 28.23    Systolic 125 131 270  Diastolic 79 86 80  Pulse 72 70     Gen: Alert, well appearing.  Patient is oriented to person, place, time, and situation. AFFECT: pleasant, lucid thought and speech. ENT: Ears: EACs clear, normal epithelium.  TMs with good light reflex and landmarks bilaterally.  Eyes: no injection, icteris, swelling, or exudate.  EOMI, PERRLA. Nose: no drainage or turbinate edema/swelling.  No injection or focal lesion.  Mouth: lips without lesion/swelling.  Oral mucosa pink and moist.  Dentition intact and without obvious caries or gingival swelling.  Oropharynx without erythema, exudate, or swelling.  Neck: supple/nontender.  No LAD, mass, or TM.  Carotid pulses 2+ bilaterally, without bruits. CV: RRR, no m/r/g.   LUNGS: CTA bilat, nonlabored resps, good aeration in all lung fields. ABD: soft, NT, ND, BS normal.  No hepatospenomegaly or mass.  No bruits. EXT: no clubbing, cyanosis, or edema.  Musculoskeletal: no joint swelling, erythema, warmth, or tenderness.  ROM of all joints intact. Skin - no sores or suspicious lesions or rashes or color changes  Pertinent labs:  Lab Results  Component Value Date   TSH 1.56 04/26/2020   Lab Results  Component Value Date   WBC 5.3 04/26/2020   HGB 13.5 04/26/2020   HCT 39.2 04/26/2020   MCV 76.1 (L) 04/26/2020   PLT 232.0 04/26/2020   Lab Results  Component Value Date   CREATININE 1.07 04/26/2020   BUN 12 04/26/2020   NA 136 04/26/2020   K 4.0 04/26/2020   CL 101 04/26/2020   CO2 28 04/26/2020   Lab Results  Component Value Date   ALT 34 04/26/2020   AST 20 04/26/2020   ALKPHOS 67 04/26/2020   BILITOT 0.9 04/26/2020   Lab  Results  Component Value Date   CHOL 158 04/26/2020   Lab Results  Component Value Date   HDL 49.10 04/26/2020   Lab Results  Component Value Date   LDLCALC 90 04/26/2020   Lab Results  Component Value Date   TRIG 95.0 04/26/2020   Lab Results  Component Value Date   CHOLHDL 3 04/26/2020   ASSESSMENT AND PLAN:   1) Health maintenance exam: Reviewed age and gender appropriate health maintenance issues (prudent diet, regular exercise, health risks of tobacco and excessive alcohol, use of seatbelts, fire alarms in home, use of sunscreen).  Also reviewed age and gender appropriate health screening as well as  vaccine recommendations. Vaccines: Tdap-->given today.   Labs: HP labs ordered Prostate ca screening: average risk patient= as per latest guidelines, start screening at 81 yrs of age. Colon ca screening: As per latest guidelines patient is due for initial screening.  He is "average" risk.  Screening options reviewed with patient today-->cologuard.  2) Cervicalgia. Improving some w/out formal PT. He'll get his neck x-ray tomorrow.  An After Visit Summary was printed and given to the patient.  FOLLOW UP:  Return in about 1 year (around 03/21/2023) for annual CPE (fasting).  Signed:  Crissie Sickles, MD           03/20/2022

## 2022-03-21 LAB — CBC
HCT: 41.2 % (ref 39.0–52.0)
Hemoglobin: 13.8 g/dL (ref 13.0–17.0)
MCHC: 33.6 g/dL (ref 30.0–36.0)
MCV: 76.5 fl — ABNORMAL LOW (ref 78.0–100.0)
Platelets: 264 10*3/uL (ref 150.0–400.0)
RBC: 5.39 Mil/uL (ref 4.22–5.81)
RDW: 13.9 % (ref 11.5–15.5)
WBC: 5 10*3/uL (ref 4.0–10.5)

## 2022-03-21 LAB — LIPID PANEL
Cholesterol: 198 mg/dL (ref 0–200)
HDL: 51.4 mg/dL (ref >39.00)
LDL Cholesterol: 111 mg/dL — ABNORMAL HIGH (ref 0–99)
NonHDL: 146.41
Total CHOL/HDL Ratio: 4
Triglycerides: 177 mg/dL — ABNORMAL HIGH (ref 0.0–149.0)
VLDL: 35.4 mg/dL (ref 0.0–40.0)

## 2022-03-21 LAB — COMPREHENSIVE METABOLIC PANEL
ALT: 41 U/L (ref 0–53)
AST: 27 U/L (ref 0–37)
Albumin: 4.7 g/dL (ref 3.5–5.2)
Alkaline Phosphatase: 64 U/L (ref 39–117)
BUN: 15 mg/dL (ref 6–23)
CO2: 28 mEq/L (ref 19–32)
Calcium: 9.7 mg/dL (ref 8.4–10.5)
Chloride: 100 mEq/L (ref 96–112)
Creatinine, Ser: 1.04 mg/dL (ref 0.40–1.50)
GFR: 86.45 mL/min (ref >60.00)
Glucose, Bld: 87 mg/dL (ref 70–99)
Potassium: 4.4 mEq/L (ref 3.5–5.1)
Sodium: 138 mEq/L (ref 135–145)
Total Bilirubin: 0.8 mg/dL (ref 0.2–1.2)
Total Protein: 7.7 g/dL (ref 6.0–8.3)

## 2022-03-21 LAB — TSH: TSH: 1.64 u[IU]/mL (ref 0.35–5.50)

## 2022-03-26 ENCOUNTER — Ambulatory Visit: Payer: Commercial Managed Care - HMO | Admitting: Physical Therapy

## 2022-04-02 LAB — COLOGUARD: COLOGUARD: NEGATIVE

## 2022-04-03 ENCOUNTER — Other Ambulatory Visit: Payer: Self-pay | Admitting: Family Medicine

## 2022-04-03 ENCOUNTER — Encounter: Payer: Self-pay | Admitting: Family Medicine

## 2022-08-06 ENCOUNTER — Other Ambulatory Visit: Payer: Self-pay | Admitting: Family Medicine

## 2022-10-23 ENCOUNTER — Other Ambulatory Visit: Payer: Self-pay | Admitting: Family Medicine

## 2023-01-10 ENCOUNTER — Other Ambulatory Visit: Payer: Self-pay | Admitting: Family Medicine

## 2023-03-25 ENCOUNTER — Other Ambulatory Visit: Payer: Self-pay | Admitting: Family Medicine

## 2023-03-25 ENCOUNTER — Encounter: Payer: Commercial Managed Care - HMO | Admitting: Family Medicine

## 2023-04-05 ENCOUNTER — Emergency Department (HOSPITAL_COMMUNITY): Payer: Federal, State, Local not specified - PPO

## 2023-04-05 ENCOUNTER — Emergency Department (HOSPITAL_COMMUNITY)
Admission: EM | Admit: 2023-04-05 | Discharge: 2023-04-05 | Disposition: A | Discharge status: Home / Self Care | Payer: Federal, State, Local not specified - PPO | Attending: Emergency Medicine | Admitting: Emergency Medicine

## 2023-04-05 ENCOUNTER — Encounter (HOSPITAL_COMMUNITY): Payer: Self-pay | Admitting: Emergency Medicine

## 2023-04-05 ENCOUNTER — Other Ambulatory Visit: Payer: Self-pay

## 2023-04-05 DIAGNOSIS — Z91010 Allergy to peanuts: Secondary | ICD-10-CM | POA: Diagnosis not present

## 2023-04-05 DIAGNOSIS — J111 Influenza due to unidentified influenza virus with other respiratory manifestations: Secondary | ICD-10-CM

## 2023-04-05 DIAGNOSIS — J101 Influenza due to other identified influenza virus with other respiratory manifestations: Secondary | ICD-10-CM | POA: Diagnosis not present

## 2023-04-05 DIAGNOSIS — M40204 Unspecified kyphosis, thoracic region: Secondary | ICD-10-CM | POA: Diagnosis not present

## 2023-04-05 DIAGNOSIS — Z20822 Contact with and (suspected) exposure to covid-19: Secondary | ICD-10-CM | POA: Insufficient documentation

## 2023-04-05 DIAGNOSIS — Z7951 Long term (current) use of inhaled steroids: Secondary | ICD-10-CM | POA: Insufficient documentation

## 2023-04-05 DIAGNOSIS — J4541 Moderate persistent asthma with (acute) exacerbation: Secondary | ICD-10-CM | POA: Insufficient documentation

## 2023-04-05 DIAGNOSIS — R0602 Shortness of breath: Secondary | ICD-10-CM | POA: Diagnosis not present

## 2023-04-05 LAB — RESP PANEL BY RT-PCR (RSV, FLU A&B, COVID)  RVPGX2
Influenza A by PCR: POSITIVE — AB
Influenza B by PCR: NEGATIVE
Resp Syncytial Virus by PCR: NEGATIVE
SARS Coronavirus 2 by RT PCR: NEGATIVE

## 2023-04-05 MED ORDER — OSELTAMIVIR PHOSPHATE 75 MG PO CAPS
75.0000 mg | ORAL_CAPSULE | Freq: Once | ORAL | Status: AC
  Administered 2023-04-05: 75 mg via ORAL
  Filled 2023-04-05: qty 1

## 2023-04-05 MED ORDER — METHYLPREDNISOLONE SODIUM SUCC 125 MG IJ SOLR
125.0000 mg | Freq: Once | INTRAMUSCULAR | Status: AC
  Administered 2023-04-05: 125 mg via INTRAVENOUS
  Filled 2023-04-05: qty 2

## 2023-04-05 MED ORDER — METHYLPREDNISOLONE 4 MG PO TBPK: ORAL_TABLET | ORAL | 0 refills | Status: DC

## 2023-04-05 MED ORDER — OSELTAMIVIR PHOSPHATE 75 MG PO CAPS: 75.0000 mg | ORAL_CAPSULE | Freq: Two times a day (BID) | ORAL | 0 refills | Status: DC

## 2023-04-05 MED ORDER — MAGNESIUM SULFATE 2 GM/50ML IV SOLN
2.0000 g | Freq: Once | INTRAVENOUS | Status: AC
  Administered 2023-04-05: 2 g via INTRAVENOUS
  Filled 2023-04-05: qty 50

## 2023-04-05 MED ORDER — ACETAMINOPHEN 325 MG PO TABS
650.0000 mg | ORAL_TABLET | Freq: Once | ORAL | Status: AC | PRN
  Administered 2023-04-05: 650 mg via ORAL
  Filled 2023-04-05: qty 2

## 2023-04-05 MED ORDER — ALBUTEROL SULFATE HFA 108 (90 BASE) MCG/ACT IN AERS: 2.0000 | INHALATION_SPRAY | RESPIRATORY_TRACT | Status: DC | PRN

## 2023-04-05 MED ORDER — BENZONATATE 100 MG PO CAPS: 100.0000 mg | ORAL_CAPSULE | Freq: Three times a day (TID) | ORAL | 0 refills | Status: DC

## 2023-04-05 MED ORDER — IPRATROPIUM-ALBUTEROL 0.5-2.5 (3) MG/3ML IN SOLN
3.0000 mL | RESPIRATORY_TRACT | Status: AC
  Administered 2023-04-05 (×3): 3 mL via RESPIRATORY_TRACT
  Filled 2023-04-05: qty 3
  Filled 2023-04-05: qty 6

## 2023-04-05 NOTE — Discharge Instructions (Addendum)
As we discussed, you tested positive for the flu today.  This is likely causing your asthma exacerbation.  Given that you are feeling better after interventions in the emergency department today, it is reasonable for you to go home and be treated there.  I have given you a prescription for a Medrol Dosepak which you should take as prescribed in its entirety for any residual asthma symptoms.  Please continue to use your home inhalers as prescribed as well.  Have also given you Tessalon which she can take for cough and Tamiflu for you to take in its entirety.  Please follow-up closely with your primary care doctor at your next scheduled appointment.  Return if development of any new or worsening symptoms.

## 2023-04-05 NOTE — ED Triage Notes (Signed)
Patient complaining of shortness of breath x1 day. Reports running out of advair but trying albuterol inhaler without relief. Endorses chills, cough, and fever at home.

## 2023-04-05 NOTE — ED Provider Notes (Signed)
[facility] AT [facility] Provider Note   CSN: [id number] Arrival date & time: 04/05/23  9562     History  Chief Complaint  Patient presents with   Shortness of Breath    Carl Tucker is a 48 y.o. male.  Patient with history of asthma presents today with complaints of wheezing, cough and congestion. He states that same began yesterday and has been worsening since. He has been trying his home inhalers with minimal improvement. States that he has also been running a fever. States this is how it feels when his asthma normally flares up.  States this only really happens when he gets an upper respiratory infection.  He denies any chest pain.  No nausea or vomiting.  He does note that he has been admitted to the hospital for asthma previously, however this was many years ago.  The history is provided by the patient. No language interpreter was used.  Shortness of Breath Associated symptoms: cough, fever and wheezing        Home Medications Prior to Admission medications   Medication Sig Start Date End Date Taking? Authorizing Provider  albuterol (VENTOLIN HFA) 108 (90 Base) MCG/ACT inhaler INHALE 2 PUFFS BY MOUTH EVERY 6 HOURS AS NEEDED FOR WHEEZING FOR SHORTNESS OF BREATH 01/13/23   McGowen, Maryjean Morn, MD  fluticasone-salmeterol (ADVAIR DISKUS) 250-50 MCG/ACT AEPB 1 p bid 02/21/22   McGowen, Maryjean Morn, MD      Allergies    Peanut-containing drug products    Review of Systems   Review of Systems  Constitutional:  Positive for fever.  HENT:  Positive for congestion.   Respiratory:  Positive for cough and wheezing.   All other systems reviewed and are negative.   Physical Exam Updated Vital Signs BP 115/83   Pulse 100   Temp (!) 100.5 F (38.1 C) (Oral)   Resp 19   Ht 5\' 9"  (1.753 m)   Wt 87.1 kg   SpO2 95%   BMI 28.35 kg/m  Physical Exam Vitals and nursing note reviewed.  Constitutional:      General: He is not in acute distress.     Appearance: Normal appearance. He is normal weight. He is not ill-appearing, toxic-appearing or diaphoretic.  HENT:     Head: Normocephalic and atraumatic.  Cardiovascular:     Rate and Rhythm: Normal rate and regular rhythm.     Heart sounds: Normal heart sounds.  Pulmonary:     Effort: Pulmonary effort is normal. No respiratory distress.     Breath sounds: Wheezing present.  Abdominal:     Palpations: Abdomen is soft.     Tenderness: There is no abdominal tenderness.  Musculoskeletal:        General: Normal range of motion.     Cervical back: Normal range of motion.     Right lower leg: No tenderness. No edema.     Left lower leg: No tenderness. No edema.  Skin:    General: Skin is warm and dry.  Neurological:     General: No focal deficit present.     Mental Status: He is alert.  Psychiatric:        Mood and Affect: Mood normal.        Behavior: Behavior normal.     ED Results / Procedures / Treatments   Labs (all labs ordered are listed, but only abnormal results are displayed) Labs Reviewed  RESP PANEL BY RT-PCR (RSV, FLU A&B, COVID)  [id number] -  Abnormal; Notable for the following components:      Result Value   Influenza A by PCR POSITIVE (*)    All other components within normal limits    EKG EKG Interpretation Date/Time:  Sunday April 05 2023 04:05:14 EST Ventricular Rate:  105 PR Interval:  152 QRS Duration:  64 QT Interval:  316 QTC Calculation: 417 R Axis:   83  Text Interpretation: Sinus tachycardia T wave abnormality, consider inferior ischemia Abnormal ECG When compared with ECG of 01/28/2011, HEART RATE has increased PREVIOUS ECG IS PRESENT Confirmed by Dione Booze ([id number]) on 04/05/2023 4:19:51 AM  Radiology DG Chest 2 View Result Date: 04/05/2023 CLINICAL DATA:  Shortness of breath. EXAM: CHEST - 2 VIEW COMPARISON:  PA Lat 05/17/2017 FINDINGS: The heart size and mediastinal contours are within normal limits. Both lungs are clear. The visualized  skeletal structures are intact, with mild thoracic kyphosis. Compare: Stable chest IMPRESSION: No active cardiopulmonary disease. Electronically Signed   By: Almira Bar M.D.   On: 04/05/2023 04:52    Procedures Procedures    Medications Ordered in ED Medications  albuterol (VENTOLIN HFA) 108 (90 Base) MCG/ACT inhaler 2 puff (has no administration in time range)  ipratropium-albuterol (DUONEB) 0.5-2.5 (3) MG/3ML nebulizer solution 3 mL (has no administration in time range)  methylPREDNISolone sodium succinate (SOLU-MEDROL) 125 mg/2 mL injection 125 mg (has no administration in time range)  magnesium sulfate IVPB 2 g 50 mL (has no administration in time range)  oseltamivir (TAMIFLU) capsule 75 mg (has no administration in time range)  acetaminophen (TYLENOL) tablet 650 mg (650 mg Oral Given 04/05/23 0407)    ED Course/ Medical Decision Making/ A&P                                 Medical Decision Making Amount and/or Complexity of Data Reviewed Radiology: ordered.  Risk OTC drugs. Prescription drug management.   This patient is a 48 y.o. male who presents to the ED for concern of wheezing, cough, congestion, this involves an extensive number of treatment options, and is a complaint that carries with it a high risk of complications and morbidity. The emergent differential diagnosis prior to evaluation includes, but is not limited to,  asthma exacerbation, URI, pneumonia, pneumothorax . This is not an exhaustive differential.   Past Medical History / Co-morbidities / Social History:  has a past medical history of Colon cancer screening, Helicobacter pylori gastritis (01/2020), Moderate persistent asthma, Seasonal allergies, Thalassemia carrier, and Tobacco dependence.  Additional history: Chart reviewed. Pertinent results include: admitted for asthma exacerbation in 2013  Physical Exam: Physical exam performed. The pertinent findings include: diffuse wheezing throughout  Lab  Tests: I ordered, and personally interpreted labs.  The pertinent results include:  flu A positive   Imaging Studies: I ordered imaging studies including CXR. I independently visualized and interpreted imaging which showed NAD. I agree with the radiologist interpretation.   Cardiac Monitoring:  The patient was maintained on a cardiac monitor.  Cardiac monitor showed an underlying rhythm of: no STEMI. I agree with this interpretation.   Medications: I ordered medication including duoneb x 3, solu-medrol, magnesium, tylenol, tamiflu  for wheezing/asthma exacerbation, fever, influenza. Reevaluation of the patient after these medicines showed that the patient improved. I have reviewed the patients home medicines and have made adjustments as needed.  Disposition: After consideration of the diagnostic results and the patients response to treatment, I feel  that emergency department workup does not suggest an emergent condition requiring admission or immediate intervention beyond what has been performed at this time. The plan is: discharge with medrol dosepak and tessalon with close outpatient follow-up and return precautions. Considered admission given profound wheezing on initial exam, however after interventions listed above, patient is feeling significantly improved and ready to go home.  He does also have the flu which is likely exacerbating his asthma, given only 1 day of symptoms will treat with Tamiflu.  First dose given today. Evaluation and diagnostic testing in the emergency department does not suggest an emergent condition requiring admission or immediate intervention beyond what has been performed at this time.  Plan for discharge with close PCP follow-up.  Patient is understanding and amenable with plan, educated on red flag symptoms that would prompt immediate return.  Patient discharged in stable condition.  Final Clinical Impression(s) / ED Diagnoses Final diagnoses:  Moderate persistent  asthma with exacerbation  Influenza    Rx / DC Orders ED Discharge Orders          Ordered    methylPREDNISolone (MEDROL DOSEPAK) 4 MG TBPK tablet        04/05/23 0949    oseltamivir (TAMIFLU) 75 MG capsule  Every 12 hours        04/05/23 0949    benzonatate (TESSALON) 100 MG capsule  Every 8 hours        04/05/23 0950          An After Visit Summary was printed and given to the patient.     Vear Clock 04/05/23 0272    Ernie Avena, MD 04/05/23 [phone]

## 2023-04-06 ENCOUNTER — Encounter: Payer: Commercial Managed Care - HMO | Admitting: Family Medicine

## 2023-04-09 ENCOUNTER — Other Ambulatory Visit: Payer: Self-pay | Admitting: Family Medicine

## 2023-04-13 ENCOUNTER — Other Ambulatory Visit: Payer: Self-pay | Admitting: Family Medicine

## 2023-04-15 ENCOUNTER — Ambulatory Visit: Payer: Federal, State, Local not specified - PPO | Admitting: Family Medicine

## 2023-04-15 VITALS — BP 134/84 | HR 73 | Temp 97.9°F | Ht 70.08 in | Wt 188.2 lb

## 2023-04-15 DIAGNOSIS — Z0001 Encounter for general adult medical examination with abnormal findings: Secondary | ICD-10-CM

## 2023-04-15 DIAGNOSIS — Z0000 Encounter for general adult medical examination without abnormal findings: Secondary | ICD-10-CM

## 2023-04-15 DIAGNOSIS — J101 Influenza due to other identified influenza virus with other respiratory manifestations: Secondary | ICD-10-CM | POA: Diagnosis not present

## 2023-04-15 DIAGNOSIS — J4541 Moderate persistent asthma with (acute) exacerbation: Secondary | ICD-10-CM | POA: Diagnosis not present

## 2023-04-15 LAB — COMPREHENSIVE METABOLIC PANEL
ALT: 47 U/L (ref 0–53)
AST: 23 U/L (ref 0–37)
Albumin: 4.3 g/dL (ref 3.5–5.2)
Alkaline Phosphatase: 62 U/L (ref 39–117)
BUN: 13 mg/dL (ref 6–23)
CO2: 29 meq/L (ref 19–32)
Calcium: 9.3 mg/dL (ref 8.4–10.5)
Chloride: 102 meq/L (ref 96–112)
Creatinine, Ser: 1.05 mg/dL (ref 0.40–1.50)
GFR: 84.83 mL/min (ref >60.00)
Glucose, Bld: 91 mg/dL (ref 70–99)
Potassium: 4.1 meq/L (ref 3.5–5.1)
Sodium: 139 meq/L (ref 135–145)
Total Bilirubin: 0.4 mg/dL (ref 0.2–1.2)
Total Protein: 7.1 g/dL (ref 6.0–8.3)

## 2023-04-15 LAB — CBC
HCT: 39.4 % (ref 39.0–52.0)
Hemoglobin: 13.2 g/dL (ref 13.0–17.0)
MCHC: 33.6 g/dL (ref 30.0–36.0)
MCV: 76.5 fL — ABNORMAL LOW (ref 78.0–100.0)
Platelets: 300 10*3/uL (ref 150.0–400.0)
RBC: 5.16 Mil/uL (ref 4.22–5.81)
RDW: 13.9 % (ref 11.5–15.5)
WBC: 6.1 10*3/uL (ref 4.0–10.5)

## 2023-04-15 LAB — LIPID PANEL
Cholesterol: 163 mg/dL (ref 0–200)
HDL: 44.8 mg/dL (ref >39.00)
LDL Cholesterol: 104 mg/dL — ABNORMAL HIGH (ref 0–99)
NonHDL: 118.3
Total CHOL/HDL Ratio: 4
Triglycerides: 71 mg/dL (ref 0.0–149.0)
VLDL: 14.2 mg/dL (ref 0.0–40.0)

## 2023-04-15 LAB — TSH: TSH: 1.87 u[IU]/mL (ref 0.35–5.50)

## 2023-04-15 MED ORDER — ALBUTEROL SULFATE HFA 108 (90 BASE) MCG/ACT IN AERS: 1.0000 | INHALATION_SPRAY | Freq: Four times a day (QID) | RESPIRATORY_TRACT | 0 refills | Status: DC | PRN

## 2023-04-15 MED ORDER — FLUTICASONE-SALMETEROL 250-50 MCG/ACT IN AEPB: INHALATION_SPRAY | RESPIRATORY_TRACT | 11 refills | Status: DC

## 2023-04-15 MED ORDER — PREDNISONE 10 MG PO TABS: ORAL_TABLET | ORAL | 0 refills | Status: DC

## 2023-04-15 NOTE — Patient Instructions (Signed)
 Health Maintenance, Male  Adopting a healthy lifestyle and getting preventive care are important in promoting health and wellness. Ask your health care provider about:  The right schedule for you to have regular tests and exams.  Things you can do on your own to prevent diseases and keep yourself healthy.  What should I know about diet, weight, and exercise?  Eat a healthy diet    Eat a diet that includes plenty of vegetables, fruits, low-fat dairy products, and lean protein.  Do not eat a lot of foods that are high in solid fats, added sugars, or sodium.  Maintain a healthy weight  Body mass index (BMI) is a measurement that can be used to identify possible weight problems. It estimates body fat based on height and weight. Your health care provider can help determine your BMI and help you achieve or maintain a healthy weight.  Get regular exercise  Get regular exercise. This is one of the most important things you can do for your health. Most adults should:  Exercise for at least 150 minutes each week. The exercise should increase your heart rate and make you sweat (moderate-intensity exercise).  Do strengthening exercises at least twice a week. This is in addition to the moderate-intensity exercise.  Spend less time sitting. Even light physical activity can be beneficial.  Watch cholesterol and blood lipids  Have your blood tested for lipids and cholesterol at 47 years of age, then have this test every 5 years.  You may need to have your cholesterol levels checked more often if:  Your lipid or cholesterol levels are high.  You are older than 47 years of age.  You are at high risk for heart disease.  What should I know about cancer screening?  Many types of cancers can be detected early and may often be prevented. Depending on your health history and family history, you may need to have cancer screening at various ages. This may include screening for:  Colorectal cancer.  Prostate cancer.  Skin cancer.  Lung  cancer.  What should I know about heart disease, diabetes, and high blood pressure?  Blood pressure and heart disease  High blood pressure causes heart disease and increases the risk of stroke. This is more likely to develop in people who have high blood pressure readings or are overweight.  Talk with your health care provider about your target blood pressure readings.  Have your blood pressure checked:  Every 3-5 years if you are 24-52 years of age.  Every year if you are 3 years old or older.  If you are between the ages of 60 and 72 and are a current or former smoker, ask your health care provider if you should have a one-time screening for abdominal aortic aneurysm (AAA).  Diabetes  Have regular diabetes screenings. This checks your fasting blood sugar level. Have the screening done:  Once every three years after age 66 if you are at a normal weight and have a low risk for diabetes.  More often and at a younger age if you are overweight or have a high risk for diabetes.  What should I know about preventing infection?  Hepatitis B  If you have a higher risk for hepatitis B, you should be screened for this virus. Talk with your health care provider to find out if you are at risk for hepatitis B infection.  Hepatitis C  Blood testing is recommended for:  Everyone born from [birth year] through 1964.  Anyone  with known risk factors for hepatitis C.  Sexually transmitted infections (STIs)  You should be screened each year for STIs, including gonorrhea and chlamydia, if:  You are sexually active and are younger than 47 years of age.  You are older than 47 years of age and your health care provider tells you that you are at risk for this type of infection.  Your sexual activity has changed since you were last screened, and you are at increased risk for chlamydia or gonorrhea. Ask your health care provider if you are at risk.  Ask your health care provider about whether you are at high risk for HIV. Your health care provider  may recommend a prescription medicine to help prevent HIV infection. If you choose to take medicine to prevent HIV, you should first get tested for HIV. You should then be tested every 3 months for as long as you are taking the medicine.  Follow these instructions at home:  Alcohol use  Do not drink alcohol if your health care provider tells you not to drink.  If you drink alcohol:  Limit how much you have to 0-2 drinks a day.  Know how much alcohol is in your drink. In the [place] one drink equals one 12 oz bottle of beer (355 mL), one 5 oz glass of wine (148 mL), or one 1 oz glass of hard liquor (44 mL).  Lifestyle  Do not use any products that contain nicotine or tobacco. These products include cigarettes, chewing tobacco, and vaping devices, such as e-cigarettes. If you need help quitting, ask your health care provider.  Do not use street drugs.  Do not share needles.  Ask your health care provider for help if you need support or information about quitting drugs.  General instructions  Schedule regular health, dental, and eye exams.  Stay current with your vaccines.  Tell your health care provider if:  You often feel depressed.  You have ever been abused or do not feel safe at home.  Summary  Adopting a healthy lifestyle and getting preventive care are important in promoting health and wellness.  Follow your health care provider's instructions about healthy diet, exercising, and getting tested or screened for diseases.  Follow your health care provider's instructions on monitoring your cholesterol and blood pressure.  This information is not intended to replace advice given to you by your health care provider. Make sure you discuss any questions you have with your health care provider.  Document Revised: 07/16/2020 Document Reviewed: 07/16/2020  Elsevier Patient Education  2024 [employer].

## 2023-04-15 NOTE — Progress Notes (Signed)
 Office Note 04/15/2023  CC:  Chief Complaint  Patient presents with   Annual Exam    Pt is fasting    HPI:  Patient is a 48 y.o. male who is here for annual health maintenance exam and follow-up recent emergency department visit for acute asthma exacerbation and influenza A.  He finished his Tamiflu .  He took a Medrol  Dosepak. He apparently had run out of Advair  the week prior to getting his illness. He feels significantly improved but does not feel like everything is back to baseline.  He feels tight in his chest and has some nagging cough as well.  No fevers.  No malaise.   Past Medical History:  Diagnosis Date   Colon cancer screening    03/2022 Cologuard neg   Helicobacter pylori gastritis 01/2020   tetra+metron tx and all sx's resolved ([facility])   Moderate persistent asthma    Since infancy.  Was on advair  at one time.  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  +URIs   Seasonal allergies    much less problematic as an adult.   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 No pertinent surgical history.  Family History  Problem Relation Age of Onset   Hypertension Mother    Diabetes Maternal Grandmother    Hypertension Maternal Grandmother    Stroke Maternal Grandmother    Asthma Maternal Grandfather    Hypertension Maternal Grandfather    Kidney disease Paternal Grandfather     Social History   Socioeconomic History   Marital status: Single    Spouse name: Not on file   Number of children: Not on file   Years of education: Not on file   Highest education level: Some college, no degree  Occupational History   Not on file  Tobacco Use   Smoking status: Former    Current packs/day: 0.50    Average packs/day: 0.5 packs/day for 12.0 years (6.0 ttl pk-yrs)    Types: Cigarettes   Smokeless tobacco: Never  Vaping Use   Vaping status: Former  Substance and Sexual Activity    Alcohol use: Yes    Alcohol/week: 8.0 standard drinks of alcohol    Types: 8 Cans of beer per week   Drug use: No    Comment: quit smoking 2 weeks ago   Sexual activity: Not on file  Other Topics Concern   Not on file  Social History Narrative   Single, no children.  3 sisters.   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   Tob: 10 pack-yr hx--current as of 06/2017.   Alc: 8 beers per week.   Social Drivers of Health   Financial Resource Strain: Medium Risk (04/15/2023)   Overall Financial Resource Strain (CARDIA)    Difficulty of Paying Living Expenses: Somewhat hard  Food Insecurity: Food Insecurity Present (04/15/2023)   Hunger Vital Sign    Worried About Running Out of Food in the Last Year: Sometimes true    Ran Out of Food in the Last Year: Never true  Transportation Needs: No Transportation Needs (04/15/2023)   PRAPARE - Administrator, Civil Service (Medical): No    Lack of Transportation (Non-Medical): No  Physical Activity: Sufficiently Active (04/15/2023)   Exercise Vital Sign    Days of Exercise per Week: 3 days    Minutes of Exercise per Session: 60 min  Stress:  No Stress Concern Present (04/15/2023)   [employer] of Occupational Health - Occupational Stress Questionnaire    Feeling of Stress : Only a little  Social Connections: Moderately Integrated (04/15/2023)   Social Connection and Isolation Panel [NHANES]    Frequency of Communication with Friends and Family: More than three times a week    Frequency of Social Gatherings with Friends and Family: Twice a week    Attends Religious Services: 1 to 4 times per year    Active Member of [employer] or Organizations: No    Attends Engineer, Structural: Not on file    Marital Status: Living with partner  Intimate Partner Violence: Unknown (06/10/2021)   Received from [employer]   HITS    Physically Hurt: Not on file    Insult or Talk Down To: Not on file     Threaten Physical Harm: Not on file    Scream or Curse: Not on file    Outpatient Medications Prior to Visit  Medication Sig Dispense Refill   benzonatate  (TESSALON ) 100 MG capsule Take 1 capsule (100 mg total) by mouth every 8 (eight) hours. 21 capsule 0   albuterol  (VENTOLIN  HFA) 108 (90 Base) MCG/ACT inhaler INHALE 2 PUFFS BY MOUTH EVERY 6 HOURS AS NEEDED FOR WHEEZING OR SHORTNESS OF BREATH 18 g 0   fluticasone -salmeterol (ADVAIR  DISKUS) 250-50 MCG/ACT AEPB 1 p bid 60 each 11   methylPREDNISolone  (MEDROL  DOSEPAK) 4 MG TBPK tablet Take as directed on package 1 each 0   oseltamivir  (TAMIFLU ) 75 MG capsule Take 1 capsule (75 mg total) by mouth every 12 (twelve) hours. 9 capsule 0   No facility-administered medications prior to visit.    Allergies  Allergen Reactions   Peanut-Containing Drug Products Swelling    Swelling of the tongue    Review of Systems  Constitutional:  Negative for appetite change, chills, fatigue and fever.  HENT:  Negative for congestion, dental problem, ear pain and sore throat.   Eyes:  Negative for discharge, redness and visual disturbance.  Respiratory:  Positive for cough, chest tightness and wheezing. Negative for shortness of breath.   Cardiovascular:  Negative for chest pain, palpitations and leg swelling.  Gastrointestinal:  Negative for abdominal pain, blood in stool, diarrhea, nausea and vomiting.  Genitourinary:  Negative for difficulty urinating, dysuria, flank pain, frequency, hematuria and urgency.  Musculoskeletal:  Negative for arthralgias, back pain, joint swelling, myalgias and neck stiffness.  Skin:  Negative for pallor and rash.  Neurological:  Negative for dizziness, speech difficulty, weakness and headaches.  Hematological:  Negative for adenopathy. Does not bruise/bleed easily.  Psychiatric/Behavioral:  Negative for confusion and sleep disturbance. The patient is not nervous/anxious.     PE;    04/15/2023    1:04 PM 04/05/2023    10:00 AM 04/05/2023    8:30 AM  Vitals with BMI  Height 5' 10.079    Weight 188 lbs 3 oz    BMI 26.94    Systolic 134 141 872  Diastolic 84 82 81  Pulse 73 108 100   Gen: Alert, well appearing.  Patient is oriented to person, place, time, and situation. AFFECT: pleasant, lucid thought and speech. ENT: Ears: EACs clear, normal epithelium.  TMs with good light reflex and landmarks bilaterally.  Eyes: no injection, icteris, swelling, or exudate.  EOMI, PERRLA. Nose: no drainage or turbinate edema/swelling.  No injection or focal lesion.  Mouth: lips without lesion/swelling.  Oral mucosa pink and moist.  Dentition intact and without obvious caries or gingival swelling.  Oropharynx without erythema, exudate, or swelling.  Neck: supple/nontender.  No LAD, mass, or TM.  Carotid pulses 2+ bilaterally, without bruits. CV: RRR, no m/r/g.   LUNGS: Diffuse expiratory wheezing, mild prolongation of expiratory phase.  No crackles.  nonlabored resps, good aeration in all lung fields.  Breathing is nonlabored. ABD: soft, NT, ND, BS normal.  No hepatospenomegaly or mass.  No bruits. EXT: no clubbing, cyanosis, or edema.  Musculoskeletal: no joint swelling, erythema, warmth, or tenderness.  ROM of all joints intact. Skin - no sores or suspicious lesions or rashes or color changes  Pertinent labs:  Lab Results  Component Value Date   TSH 1.64 03/20/2022   Lab Results  Component Value Date   WBC 5.0 03/20/2022   HGB 13.8 03/20/2022   HCT 41.2 03/20/2022   MCV 76.5 (L) 03/20/2022   PLT 264.0 03/20/2022   Lab Results  Component Value Date   CREATININE 1.04 03/20/2022   BUN 15 03/20/2022   NA 138 03/20/2022   K 4.4 03/20/2022   CL 100 03/20/2022   CO2 28 03/20/2022   Lab Results  Component Value Date   ALT 41 03/20/2022   AST 27 03/20/2022   ALKPHOS 64 03/20/2022   BILITOT 0.8 03/20/2022   Lab Results  Component Value Date   CHOL 198 03/20/2022   Lab Results  Component Value Date    HDL 51.40 03/20/2022   Lab Results  Component Value Date   LDLCALC 111 (H) 03/20/2022   Lab Results  Component Value Date   TRIG 177.0 (H) 03/20/2022   Lab Results  Component Value Date   CHOLHDL 4 03/20/2022   ASSESSMENT AND PLAN:   No problem-specific Assessment & Plan notes found for this encounter.  1) Health maintenance exam: Reviewed age and gender appropriate health maintenance issues (prudent diet, regular exercise, health risks of tobacco and excessive alcohol, use of seatbelts, fire alarms in home, use of sunscreen).  Also reviewed age and gender appropriate health screening as well as vaccine recommendations. Vaccines: Prevnar->deferred.   Labs: HP labs ordered Prostate ca screening: average risk patient= as per latest guidelines, start screening at 50 yrs of age. Colon ca screening: As per latest guidelines patient is due for initial screening.  Next Cologuard 03/2025.  #2 moderate persistent asthma with recent influenza infection + acute exacerbation. He has had good control for the last couple of years on Advair  250/50, 1 puff twice a day. Refill this as well as refill Ventolin  today. Additionally, will treat with prednisone : 30 mg daily x 3 days, then 20 mg daily x 3 days, then 10 mg daily x 3 days. He will send MyChart message, call, or return if not significantly improved at the end of this prednisone  taper.  An After Visit Summary was printed and given to the patient.  FOLLOW UP:  Return in about 1 year (around 04/14/2024) for annual CPE (fasting).  Signed:  Gerlene Hockey, MD           04/15/2023

## 2023-07-25 ENCOUNTER — Other Ambulatory Visit: Payer: Self-pay | Admitting: Family Medicine

## 2023-10-07 ENCOUNTER — Other Ambulatory Visit: Payer: Self-pay | Admitting: Family Medicine

## 2023-12-03 ENCOUNTER — Other Ambulatory Visit: Payer: Self-pay | Admitting: Family Medicine

## 2023-12-28 ENCOUNTER — Encounter: Payer: Self-pay | Admitting: Family Medicine

## 2023-12-28 DIAGNOSIS — M25511 Pain in right shoulder: Secondary | ICD-10-CM

## 2023-12-28 NOTE — Telephone Encounter (Signed)
 Okay, please clarify which shoulder.  Order DG shoulder x-ray for [facility] imaging on [address].  Appointment with me at his convenience after the x-ray and we can discuss appropriate treatment.

## 2024-01-15 ENCOUNTER — Ambulatory Visit: Admitting: Family Medicine

## 2024-01-15 ENCOUNTER — Encounter: Payer: Self-pay | Admitting: Family Medicine

## 2024-01-15 ENCOUNTER — Other Ambulatory Visit (HOSPITAL_COMMUNITY)
Admission: RE | Admit: 2024-01-15 | Discharge: 2024-01-15 | Disposition: A | Source: Ambulatory Visit | Attending: Family Medicine | Admitting: Family Medicine

## 2024-01-15 VITALS — BP 122/76 | HR 72 | Temp 97.4°F | Ht 70.08 in | Wt 194.2 lb

## 2024-01-15 DIAGNOSIS — R310 Gross hematuria: Secondary | ICD-10-CM | POA: Diagnosis not present

## 2024-01-15 DIAGNOSIS — R300 Dysuria: Secondary | ICD-10-CM

## 2024-01-15 DIAGNOSIS — Z113 Encounter for screening for infections with a predominantly sexual mode of transmission: Secondary | ICD-10-CM

## 2024-01-15 LAB — POCT URINALYSIS DIPSTICK
Bilirubin, UA: NEGATIVE
Blood, UA: NEGATIVE
Glucose, UA: NEGATIVE
Ketones, UA: NEGATIVE
Leukocytes, UA: NEGATIVE
Nitrite, UA: NEGATIVE
Protein, UA: NEGATIVE
Spec Grav, UA: 1.02 (ref 1.010–1.025)
Urobilinogen, UA: NEGATIVE U/dL — AB
pH, UA: 6 (ref 5.0–8.0)

## 2024-01-15 NOTE — Progress Notes (Signed)
 OFFICE VISIT  01/15/2024  CC:  Chief Complaint  Patient presents with   Urinary Concern    Pt c/o burning with urination and blood, urgency ; first noticed 3 days ago. Denies abd, back, or flank pain. Has not taken any otc meds    Patient is a 48 y.o. male who presents for urinary concern.  HPI: 3 days ago he had stinging/sharp pain with urination and noticed blood.  Happened a few times that day and the next day but cleared up completely yesterday and today.  No more voiding discomfort.  No unusual urgency or frequency. No fever, nausea, flank pain, abdominal pain, or rectal discomfort. He has 1 male sexual partner.  Denies any recent new partner. His partner just recently got STD tested and was negative.  He does smoke.  No family history of bladder cancer.   Past Medical History:  Diagnosis Date   Allergy Jul 16[birth year] 1976   Colon cancer screening    03/2022 Cologuard neg   Helicobacter pylori gastritis 01/2020   tetra+metron tx and all sx's resolved ([facility])   Moderate persistent asthma    Since infancy.  Was on advair  at one time.  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  +URIs   Seasonal allergies    much less problematic as an adult.   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 No pertinent surgical history.  Outpatient Medications Prior to Visit  Medication Sig Dispense Refill   albuterol  (VENTOLIN  HFA) 108 (90 Base) MCG/ACT inhaler INHALE 1 TO 2 PUFFS BY MOUTH EVERY 6 HOURS AS NEEDED FOR WHEEZING OR SHORTNESS OF BREATH 18 g 1   fluticasone -salmeterol (ADVAIR  DISKUS) 250-50 MCG/ACT AEPB 1 p bid 60 each 11   Ibuprofen (ADVIL PO) Take by mouth as needed.     benzonatate  (TESSALON ) 100 MG capsule Take 1 capsule (100 mg total) by mouth every 8 (eight) hours. 21 capsule 0   predniSONE  (DELTASONE ) 10 MG tablet 3 every day x 3d then 2 every day x 3d then 1 every day x 3d 18 tablet 0   No  facility-administered medications prior to visit.    Allergies  Allergen Reactions   Peanut-Containing Drug Products Swelling    Swelling of the tongue    Review of Systems  As per HPI  PE:    01/15/2024   11:05 AM 04/15/2023    1:04 PM 04/05/2023   10:00 AM  Vitals with BMI  Height 5' 10.08 5' 10.079   Weight 194 lbs 3 oz 188 lbs 3 oz   BMI 27.8 26.94   Systolic 122 134 858  Diastolic 76 84 82  Pulse 72 73 108     Physical Exam  Gen: Alert, well appearing.  Patient is oriented to person, place, time, and situation. AFFECT: pleasant, lucid thought and speech. Abdomen is soft and nontender.   LABS:  Last CBC Lab Results  Component Value Date   WBC 6.1 04/15/2023   HGB 13.2 04/15/2023   HCT 39.4 04/15/2023   MCV 76.5 (L) 04/15/2023   MCH 26.0 (L) 04/02/2018   RDW 13.9 04/15/2023   PLT 300.0 04/15/2023   Last metabolic panel Lab Results  Component Value Date   GLUCOSE 91 04/15/2023   NA 139 04/15/2023   K 4.1 04/15/2023   CL 102 04/15/2023   CO2 29 04/15/2023   BUN 13 04/15/2023   CREATININE 1.05 04/15/2023  GFR 84.83 04/15/2023   CALCIUM 9.3 04/15/2023   PROT 7.1 04/15/2023   ALBUMIN 4.3 04/15/2023   BILITOT 0.4 04/15/2023   ALKPHOS 62 04/15/2023   AST 23 04/15/2023   ALT 47 04/15/2023   IMPRESSION AND PLAN:  #1 gross hematuria with 1 day of voiding discomfort. Resolved the last 2 days. Urinalysis normal today. Will send for culture as well as GC and chlamydia testing. RPR and HIV testing done today to complete screening. If he remains without any further symptoms and his urine testing is normal then I recommended we proceed with standard evaluation for gross hematuria.  We discussed this workup today--> CT hematuria protocol and referral to urology for consideration of cystoscopy.  An After Visit Summary was printed and given to the patient.  FOLLOW UP: Return if symptoms worsen or fail to improve.  Signed:  Gerlene Hockey, MD            01/15/2024

## 2024-01-16 LAB — HIV ANTIBODY (ROUTINE TESTING W REFLEX)
HIV 1&2 Ab, 4th Generation: NONREACTIVE
HIV FINAL INTERPRETATION: NEGATIVE

## 2024-01-16 LAB — URINE CULTURE
MICRO NUMBER:: 17207064
Result:: NO GROWTH
SPECIMEN QUALITY:: ADEQUATE

## 2024-01-16 LAB — RPR: RPR Ser Ql: NONREACTIVE

## 2024-01-19 ENCOUNTER — Ambulatory Visit: Admitting: Orthopaedic Surgery

## 2024-01-19 ENCOUNTER — Other Ambulatory Visit (INDEPENDENT_AMBULATORY_CARE_PROVIDER_SITE_OTHER): Payer: Self-pay

## 2024-01-19 DIAGNOSIS — M25511 Pain in right shoulder: Secondary | ICD-10-CM

## 2024-01-19 DIAGNOSIS — G8929 Other chronic pain: Secondary | ICD-10-CM

## 2024-01-19 LAB — URINE CYTOLOGY ANCILLARY ONLY
Chlamydia: NEGATIVE
Comment: NEGATIVE

## 2024-01-19 MED ORDER — MELOXICAM 15 MG PO TABS: 15.0000 mg | ORAL_TABLET | Freq: Every day | ORAL | 2 refills | Status: AC

## 2024-01-19 NOTE — Progress Notes (Signed)
 Office Visit Note   Patient: Carl Tucker           Date of Birth: [birth year]           MRN: [id number] Visit Date: 01/19/2024              Requested by: Candise Aleene DEL, MD [address] [place],  [place] [address] PCP: Candise Aleene DEL, MD   Assessment & Plan: Visit Diagnoses:  1. Chronic right shoulder pain     Plan: History of Present Illness Carl Tucker is a 48 year old male who presents with right shoulder pain for evaluation.  He experiences right shoulder pain for approximately eight months, located across the shoulder. The pain worsens with arm lifting, especially during backward motion, and when sleeping on the affected side. Pain severity is five out of ten upon waking and occurs daily.  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 No shoulder injury is reported.  Physical Exam MUSCULOSKELETAL: Pain at top of shoulder flexion. No Hawkins impingement. External rotation normal. Shoulder flexibility normal. Rotator cuff function normal.  Assessment and Plan Right shoulder pain due to rotator cuff tendinitis/tendinosis Chronic right shoulder pain likely due to rotator cuff tendinitis or tendinosis from repetitive shoulder movements. No injury history. Impingement suggested by tightness with flexion. - Prescribed anti-inflammatory medication. - Advised light duty for four weeks. - Provided work note restricting strenuous right arm activities. - Instructed to return for follow-up if no improvement.  Follow-Up Instructions: No follow-ups on file.   Orders:  Orders Placed This Encounter  Procedures   XR Shoulder Right   Meds ordered this encounter  Medications   meloxicam (MOBIC) 15 MG tablet    Sig: Take 1 tablet (15 mg total) by mouth daily.    Dispense:  14 tablet    Refill:  2       Procedures: No procedures performed   Clinical Data: No additional findings.   Subjective: Chief Complaint  Patient presents with   Right Shoulder - Pain    HPI  Review of Systems  Constitutional: Negative.   HENT: Negative.    Eyes: Negative.   Respiratory: Negative.    Cardiovascular: Negative.   Gastrointestinal: Negative.   Endocrine: Negative.   Genitourinary: Negative.   Skin: Negative.   Allergic/Immunologic: Negative.   Neurological: Negative.   Hematological: Negative.   Psychiatric/Behavioral: Negative.    All other systems reviewed and are negative.    Objective: Vital Signs: There were no vitals taken for this visit.  Physical Exam Vitals and nursing note reviewed.  Constitutional:      Appearance: He is well-developed.  HENT:     Head: Normocephalic and atraumatic.  Eyes:     Pupils: Pupils are equal, round, and reactive to light.  Pulmonary:     Effort: Pulmonary effort is normal.  Abdominal:     Palpations: Abdomen is soft.  Musculoskeletal:        General: Normal range of motion.     Cervical back: Neck supple.  Skin:    General: Skin is warm.  Neurological:     Mental Status: He is alert and oriented to person, place, and time.  Psychiatric:        Behavior: Behavior normal.        Thought Content: Thought content  normal.        Judgment: Judgment normal.     Ortho Exam  Specialty Comments:  No specialty comments available.  Imaging: XR Shoulder Right Result Date: 01/19/2024 3 view xrays show no acute or structural abnormalities    PMFS History: Patient Active Problem List   Diagnosis Date Noted   Bronchitis, acute 01/29/2011   GERD (gastroesophageal reflux disease) 01/29/2011   Tobacco abuse 01/29/2011   Asthma exacerbation 01/28/2011    Class: Acute   Past Medical History:  Diagnosis Date   Allergy 1975/06/07   Colon cancer screening    03/2022 Cologuard neg   Helicobacter pylori gastritis 01/2020    tetra+metron tx and all sx's resolved ([facility])   Moderate persistent asthma    Since infancy.  Was on advair  at one time.  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  +URIs   Seasonal allergies    much less problematic as an adult.   Thalassemia carrier    suspected->chronic mildly low MCV and normal Hb   Tobacco dependence     Family History  Problem Relation Age of Onset   Hypertension Mother    Diabetes Maternal Grandmother    Hypertension Maternal Grandmother    Stroke Maternal Grandmother    Asthma Maternal Grandfather    Hypertension Maternal Grandfather    Kidney disease Paternal Grandfather     No past surgical history on file. Social History   Occupational History   Not on file  Tobacco Use   Smoking status: Former    Current packs/day: 0.50    Average packs/day: 0.5 packs/day for 12.0 years (6.0 ttl pk-yrs)    Types: Cigarettes   Smokeless tobacco: Never  Vaping Use   Vaping status: Former  Substance and Sexual Activity   Alcohol use: Yes    Alcohol/week: 8.0 standard drinks of alcohol    Types: 8 Cans of beer per week   Drug use: No    Comment: quit smoking 2 weeks ago   Sexual activity: Not on file

## 2024-01-20 ENCOUNTER — Ambulatory Visit: Payer: Self-pay | Admitting: Family Medicine

## 2024-01-20 NOTE — Telephone Encounter (Signed)
 No further action needed at this time.

## 2024-02-10 ENCOUNTER — Other Ambulatory Visit: Payer: Self-pay | Admitting: Family Medicine

## 2024-04-15 ENCOUNTER — Encounter: Payer: Self-pay | Admitting: Family Medicine

## 2024-04-15 ENCOUNTER — Ambulatory Visit: Payer: Federal, State, Local not specified - PPO | Admitting: Family Medicine

## 2024-04-15 VITALS — BP 127/86 | HR 77 | Temp 98.7°F | Ht 69.75 in | Wt 196.2 lb

## 2024-04-15 DIAGNOSIS — J4540 Moderate persistent asthma, uncomplicated: Secondary | ICD-10-CM

## 2024-04-15 DIAGNOSIS — F4323 Adjustment disorder with mixed anxiety and depressed mood: Secondary | ICD-10-CM

## 2024-04-15 DIAGNOSIS — Z0000 Encounter for general adult medical examination without abnormal findings: Secondary | ICD-10-CM

## 2024-04-15 DIAGNOSIS — M7541 Impingement syndrome of right shoulder: Secondary | ICD-10-CM

## 2024-04-15 DIAGNOSIS — R310 Gross hematuria: Secondary | ICD-10-CM

## 2024-04-15 MED ORDER — ALBUTEROL SULFATE HFA 108 (90 BASE) MCG/ACT IN AERS: 2.0000 | INHALATION_SPRAY | RESPIRATORY_TRACT | 1 refills | Status: AC | PRN

## 2024-04-15 MED ORDER — FLUTICASONE-SALMETEROL 250-50 MCG/ACT IN AEPB: INHALATION_SPRAY | RESPIRATORY_TRACT | 11 refills | Status: AC

## 2024-04-15 NOTE — Patient Instructions (Signed)
Shoulder Impingement Syndrome Rehab Ask your health care provider which exercises are safe for you. Do exercises exactly as told by your provider and adjust them as told. It is normal to feel mild stretching, pulling, tightness, or discomfort as you do these exercises. Stop right away if you feel sudden pain or your pain gets worse. Do not begin these exercises until told by your provider. Stretching and range-of-motion exercise This exercise warms up your muscles and joints and improves the movement and flexibility of your shoulder. This exercise also helps to relieve pain and stiffness. Passive horizontal adduction In passive adduction, you use your other hand to move the injured arm toward your body. The injured arm does not move on its own. In this movement, your arm is moved across your body in the horizontal plane (horizontal adduction). Sit or stand and pull your left / right elbow across your chest, toward your other shoulder. Stop when you feel a gentle stretch in the back of your shoulder and upper arm. Keep your arm at shoulder height. Keep your arm as close to your body as you comfortably can. Hold for __________ seconds. Slowly return to the starting position. Repeat __________ times. Complete this exercise __________ times a day. Strengthening exercises These exercises build strength and endurance in your shoulder. Endurance is the ability to use your muscles for a long time, even after they get tired. External rotation, isometric This is an exercise in which you press the back of your wrist against a doorframe without moving your shoulder joint (isometric). Stand or sit in a doorway, facing the door frame. Bend your left / right elbow and place the back of your wrist against the doorframe. Only the back of your wrist should be touching the frame. Keep your upper arm at your side. Gently press your wrist against the doorframe, as if you are trying to push your arm away from your  abdomen (external rotation). Press as hard as you are able without pain. Avoid shrugging your shoulder while you press your wrist against the doorframe. Keep your shoulder blade tucked down toward the middle of your back. Hold for __________ seconds. Slowly release the tension, and relax your muscles completely before you repeat the exercise. Repeat __________ times. Complete this exercise __________ times a day. Internal rotation, isometric This is an exercise in which you press your palm against a doorframe without moving your shoulder joint (isometric). Stand or sit in a doorway, facing the doorframe. Bend your left / right elbow and place the palm of your hand against the doorframe. Only your palm should be touching the frame. Keep your upper arm at your side. Gently press your hand against the doorframe, as if you are trying to push your arm toward your abdomen (internal rotation). Press as hard as you are able without pain. Avoid shrugging your shoulder while you press your hand against the doorframe. Keep your shoulder blade tucked down toward the middle of your back. Hold for __________ seconds. Slowly release the tension, and relax your muscles completely before you repeat the exercise. Repeat __________ times. Complete this exercise __________ times a day. Scapular protraction, supine, isotonic  Lie on your back on a firm surface (supine position). Hold a __________ weight in your left / right hand. Raise your left / right arm straight into the air so your hand is directly above your shoulder joint. Push the weight into the air so your shoulder (scapula) lifts off the surface that you are lying on.  The scapula will push up or forward (protraction). Do not move your head, neck, or back. Hold for __________ seconds. Slowly return to the starting position. Let your muscles relax completely before you repeat this exercise. Repeat __________ times. Complete this exercise __________ times a  day. Scapular retraction, isotonic  Sit in a stable chair without armrests or stand up. Secure an exercise band to a stable object in front of you so the band is at shoulder height. Hold one end of the exercise band in each hand. Squeeze your shoulder blades together (retraction) and move your elbows slightly behind you. Do not shrug your shoulders upward while you do this. Hold for __________ seconds. Slowly return to the starting position. Repeat __________ times. Complete this exercise __________ times a day. Shoulder extension, isotonic  Sit in a stable chair without armrests or stand up. Secure an exercise band to a stable object in front of you so the band is above shoulder height. Hold one end of the exercise band in each hand. Straighten your elbows and lift your hands up to shoulder height. Squeeze your shoulder blades together and pull your hands down to the sides of your thighs (extension). Stop when your hands are straight down by your sides. Do not let your hands go behind your body. Hold for __________ seconds. Slowly return to the starting position. Repeat __________ times. Complete this exercise __________ times a day. This information is not intended to replace advice given to you by your health care provider. Make sure you discuss any questions you have with your health care provider. Document Revised: 11/21/2021 Document Reviewed: 11/21/2021 Elsevier Patient Education  2024 [employer].

## 2024-04-15 NOTE — Progress Notes (Signed)
 "     Office Note 04/15/2024  CC:  Chief Complaint  Patient presents with   Annual Exam    Pt is fasting    HPI:  Patient is a 49 y.o. male who is here for annual health maintenance exam and follow-up gross hematuria that occurred 3 months ago.  No further episodes of blood in urine.  No urinary voiding symptoms at all.  Asthma well-controlled on daily Advair .  He has been going through a break-up with his girlfriend and has been dealing with some intermittently depressed mood and poor sleep.  He has been seeing a veterinary surgeon. Due to this stress he has continued to smoke cigarettes.  Has right shoulder impingement symptoms.  He saw an orthopedist (Dr. Jerri) a few months ago for this.  X-ray was normal at that time.  He was prescribed anti-inflammatory but he says he never picked it up. No neck pain or radiating arm pain or arm paresthesias or weakness.  Past Medical History:  Diagnosis Date   Allergy November [birth year], 1976   Colon cancer screening    03/2022 Cologuard neg   Helicobacter pylori gastritis 01/2020   tetra+metron tx and all sx's resolved ([facility])   Moderate persistent asthma    Since infancy.  Was on advair  at one time.  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  +URIs   Seasonal allergies    much less problematic as an adult.   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 No pertinent surgical history.  Family History  Problem Relation Age of Onset   Hypertension Mother    Diabetes Maternal Grandmother    Hypertension Maternal Grandmother    Stroke Maternal Grandmother    Asthma Maternal Grandfather    Hypertension Maternal Grandfather    Kidney disease Paternal Grandfather     Social History   Socioeconomic History   Marital status: Single    Spouse name: Not on file   Number of children: Not on file   Years of education: Not on file   Highest education level: Some college, no degree   Occupational History   Not on file  Tobacco Use   Smoking status: Some Days    Current packs/day: 0.50    Average packs/day: 0.5 packs/day for 12.0 years (6.0 ttl pk-yrs)    Types: Cigarettes   Smokeless tobacco: Never  Vaping Use   Vaping status: Former  Substance and Sexual Activity   Alcohol use: Yes    Alcohol/week: 5.0 standard drinks of alcohol    Types: 5 Cans of beer per week   Drug use: Not Currently    Frequency: 4.0 times per week    Types: Marijuana    Comment: quit smoking 2 weeks ago   Sexual activity: Yes    Birth control/protection: None  Other Topics Concern   Not on file  Social History Narrative   Single, no children.  3 sisters.   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   Tob: 10 pack-yr hx--current as of 06/2017.   Alc: 8 beers per week.   Social Drivers of Health   Tobacco Use: High Risk (04/15/2024)   Patient History    Smoking Tobacco Use: Some Days    Smokeless Tobacco Use: Never    Passive Exposure: Not on file  Financial Resource Strain: High Risk (04/14/2024)   Overall Financial Resource Strain (CARDIA)  Difficulty of Paying Living Expenses: Hard  Food Insecurity: Food Insecurity Present (04/14/2024)   Epic    Worried About Programme Researcher, Broadcasting/film/video in the Last Year: Sometimes true    Ran Out of Food in the Last Year: Never true  Transportation Needs: Unmet Transportation Needs (04/14/2024)   Epic    Lack of Transportation (Medical): No    Lack of Transportation (Non-Medical): Yes  Physical Activity: Sufficiently Active (04/14/2024)   Exercise Vital Sign    Days of Exercise per Week: 5 days    Minutes of Exercise per Session: 90 min  Stress: Stress Concern Present (04/14/2024)   [employer] of Occupational Health - Occupational Stress Questionnaire    Feeling of Stress: To some extent  Social Connections: Moderately Integrated (04/14/2024)   Social Connection and Isolation Panel    Frequency of Communication  with Friends and Family: Twice a week    Frequency of Social Gatherings with Friends and Family: Twice a week    Attends Religious Services: 1 to 4 times per year    Active Member of [employer] or Organizations: No    Attends Engineer, Structural: Not on file    Marital Status: Living with partner  Intimate Partner Violence: Unknown (06/10/2021)   Received from [facility]   HITS    Physically Hurt: Not on file    Insult or Talk Down To: Not on file    Threaten Physical Harm: Not on file    Scream or Curse: Not on file  Depression (PHQ2-9): Low Risk (04/15/2024)   Depression (PHQ2-9)    PHQ-2 Score: 0  Alcohol Screen: Low Risk (04/14/2024)   Alcohol Screen    Last Alcohol Screening Score (AUDIT): 4  Housing: High Risk (04/14/2024)   Epic    Unable to Pay for Housing in the Last Year: Yes    Number of Times Moved in the Last Year: 0    Homeless in the Last Year: No  Utilities: Not on file  Health Literacy: Not on file    Outpatient Medications Prior to Visit  Medication Sig Dispense Refill   Ibuprofen (ADVIL PO) Take by mouth as needed.     meloxicam  (MOBIC ) 15 MG tablet Take 1 tablet (15 mg total) by mouth daily. (Patient not taking: Reported on 04/15/2024) 14 tablet 2   albuterol  (VENTOLIN  HFA) 108 (90 Base) MCG/ACT inhaler INHALE 1 TO 2 PUFFS BY MOUTH EVERY 6 HOURS AS NEEDED FOR WHEEZING OR SHORTNESS OF BREATH 9 g 0   fluticasone -salmeterol (ADVAIR  DISKUS) 250-50 MCG/ACT AEPB 1 p bid 60 each 11   No facility-administered medications prior to visit.    Allergies[1]  Review of Systems  Constitutional:  Negative for appetite change, chills, fatigue and fever.  HENT:  Negative for congestion, dental problem, ear pain and sore throat.   Eyes:  Negative for discharge, redness and visual disturbance.  Respiratory:  Negative for cough, chest tightness, shortness of breath and wheezing.   Cardiovascular:  Negative for chest pain, palpitations and leg swelling.  Gastrointestinal:   Negative for abdominal pain, blood in stool, diarrhea, nausea and vomiting.  Genitourinary:  Negative for difficulty urinating, dysuria, flank pain, frequency, hematuria and urgency.  Musculoskeletal:  Positive for arthralgias (right shoulder). Negative for back pain, joint swelling, myalgias and neck stiffness.  Skin:  Negative for pallor and rash.  Neurological:  Negative for dizziness, speech difficulty, weakness and headaches.  Hematological:  Negative for adenopathy. Does not bruise/bleed easily.  Psychiatric/Behavioral:  Positive  for dysphoric mood and sleep disturbance. Negative for confusion. The patient is not nervous/anxious.     PE;    04/15/2024    1:07 PM 01/15/2024   11:05 AM 04/15/2023    1:04 PM  Vitals with BMI  Height 5' 9.75 5' 10.08 5' 10.079  Weight 196 lbs 3 oz 194 lbs 3 oz 188 lbs 3 oz  BMI 28.34 27.8 26.94  Systolic 127 122 865  Diastolic 86 76 84  Pulse 77 72 73    Gen: Alert, well appearing.  Patient is oriented to person, place, time, and situation. AFFECT: pleasant, lucid thought and speech. ENT: Ears: EACs clear, normal epithelium.  TMs with good light reflex and landmarks bilaterally.  Eyes: no injection, icteris, swelling, or exudate.  EOMI, PERRLA. Nose: no drainage or turbinate edema/swelling.  No injection or focal lesion.  Mouth: lips without lesion/swelling.  Oral mucosa pink and moist.  Dentition intact and without obvious caries or gingival swelling.  Oropharynx without erythema, exudate, or swelling.  Neck: supple/nontender.  No LAD, mass, or TM.  Carotid pulses 2+ bilaterally, without bruits. CV: RRR, no m/r/g.   LUNGS: CTA bilat, nonlabored resps, good aeration in all lung fields. ABD: soft, NT, ND, BS normal.  No hepatospenomegaly or mass.  No bruits. EXT: no clubbing, cyanosis, or edema.  Musculoskeletal: no joint swelling, erythema, warmth, or tenderness.  ROM of all joints intact. Skin - no sores or suspicious lesions or rashes or color  changes  Pertinent labs:  Lab Results  Component Value Date   TSH 1.87 04/15/2023   Lab Results  Component Value Date   WBC 6.1 04/15/2023   HGB 13.2 04/15/2023   HCT 39.4 04/15/2023   MCV 76.5 (L) 04/15/2023   PLT 300.0 04/15/2023   Lab Results  Component Value Date   CREATININE 1.05 04/15/2023   BUN 13 04/15/2023   NA 139 04/15/2023   K 4.1 04/15/2023   CL 102 04/15/2023   CO2 29 04/15/2023   Lab Results  Component Value Date   ALT 47 04/15/2023   AST 23 04/15/2023   ALKPHOS 62 04/15/2023   BILITOT 0.4 04/15/2023   Lab Results  Component Value Date   CHOL 163 04/15/2023   Lab Results  Component Value Date   HDL 44.80 04/15/2023   Lab Results  Component Value Date   LDLCALC 104 (H) 04/15/2023   Lab Results  Component Value Date   TRIG 71.0 04/15/2023   Lab Results  Component Value Date   CHOLHDL 4 04/15/2023   ASSESSMENT AND PLAN:   1) Health maintenance exam: Reviewed age and gender appropriate health maintenance issues (prudent diet, regular exercise, health risks of tobacco and excessive alcohol, use of seatbelts, fire alarms in home, use of sunscreen).  Also reviewed age and gender appropriate health screening as well as vaccine recommendations. Vaccines: Prevnar->deferred.   Labs: HP labs ordered Prostate ca screening: average risk patient= as per latest guidelines, start screening at 50 yrs of age. Colon ca screening: As per latest guidelines patient is due for initial screening.  Next Cologuard 03/2025.  #2 adjustment disorder with mixed anxious and depressed mood. Sleep dysfunction is his main issue right now.  He is doing well with counseling.  He declines medication at this time.  3.  Moderate persistent asthma. Well-controlled with Advair  2 50-50, 1 puff twice daily.  He has albuterol  to use every 4 hours as needed.  4.  Right shoulder impingement syndrome. Discussed home rehab  exercises, educational handout given. He will consider  returning either to me or to the orthopedist for an injection or request formal physical therapy at any point in time.  #5 gross hematuria. 1 episode back in November last year. Refer to urology.  An After Visit Summary was printed and given to the patient.  FOLLOW UP:  Return in about 6 months (around 10/13/2024) for routine chronic illness f/u.  Signed:  Gerlene Hockey, MD           04/15/2024     [1]  Allergies Allergen Reactions   Peanut-Containing Drug Products Swelling    Swelling of the tongue   "

## 2024-10-14 ENCOUNTER — Ambulatory Visit: Admitting: Family Medicine
